# Patient Record
Sex: Female | Born: 1980 | Race: White | Hispanic: No | Marital: Married | Smoking: Never smoker
Health system: Southern US, Community
[De-identification: ages and names within clinical notes are randomized; demographics above are authoritative.]

## PROBLEM LIST (undated history)

## (undated) DIAGNOSIS — I8390 Asymptomatic varicose veins of unspecified lower extremity: Secondary | ICD-10-CM

## (undated) DIAGNOSIS — Z531 Procedure and treatment not carried out because of patient's decision for reasons of belief and group pressure: Secondary | ICD-10-CM

## (undated) DIAGNOSIS — E063 Autoimmune thyroiditis: Secondary | ICD-10-CM

## (undated) DIAGNOSIS — D509 Iron deficiency anemia, unspecified: Secondary | ICD-10-CM

## (undated) DIAGNOSIS — Z9889 Other specified postprocedural states: Secondary | ICD-10-CM

## (undated) DIAGNOSIS — Z8742 Personal history of other diseases of the female genital tract: Secondary | ICD-10-CM

## (undated) DIAGNOSIS — R112 Nausea with vomiting, unspecified: Secondary | ICD-10-CM

## (undated) DIAGNOSIS — Z973 Presence of spectacles and contact lenses: Secondary | ICD-10-CM

## (undated) DIAGNOSIS — Z789 Other specified health status: Secondary | ICD-10-CM

## (undated) HISTORY — PX: OTHER SURGICAL HISTORY: SHX169

## (undated) HISTORY — DX: Personal history of other diseases of the female genital tract: Z87.42

## (undated) HISTORY — PX: NO PAST SURGERIES: SHX2092

---

## 2002-07-03 HISTORY — PX: LUMBAR FUSION: SHX111

## 2007-07-04 HISTORY — PX: FINGER SURGERY: SHX640

## 2011-07-04 NOTE — L&D Delivery Note (Signed)
Delivery Note At 1:50 PM a viable and healthy female was delivered via Vaginal, Vacuum (Extractor) (Presentation: Left Occiput Anterior).  APGAR: 9, 9; weight 7 lb 4.2 oz (3294 g).  Kiwi vac applied at outlet due to fetal bradycardia.  Placenta status: Intact, Spontaneous.  Cord: 3 vessels.  Anesthesia: Pudendal  Episiotomy: None Lacerations: 2nd degree;Perineal Suture Repair: 3.0 chromic Est. Blood Loss (mL): 350  Mom to postpartum.  Baby to nursery-stable.  August Gosser D 01/23/2012, 2:55 PM

## 2011-08-08 LAB — OB RESULTS CONSOLE GC/CHLAMYDIA: Gonorrhea: NEGATIVE

## 2011-08-08 LAB — OB RESULTS CONSOLE HIV ANTIBODY (ROUTINE TESTING): HIV: NONREACTIVE

## 2011-08-08 LAB — OB RESULTS CONSOLE GBS: GBS: NEGATIVE

## 2012-01-23 ENCOUNTER — Encounter (HOSPITAL_COMMUNITY): Payer: Self-pay | Admitting: *Deleted

## 2012-01-23 ENCOUNTER — Inpatient Hospital Stay (HOSPITAL_COMMUNITY)
Admission: AD | Admit: 2012-01-23 | Discharge: 2012-01-25 | DRG: 775 | Disposition: A | Discharge status: Home / Self Care | Payer: Medicaid Other | Source: Ambulatory Visit | Attending: Obstetrics & Gynecology | Admitting: Obstetrics & Gynecology

## 2012-01-23 HISTORY — DX: Other specified health status: Z78.9

## 2012-01-23 LAB — CBC
HCT: 34.2 % — ABNORMAL LOW (ref 36.0–46.0)
Hemoglobin: 11.6 g/dL — ABNORMAL LOW (ref 12.0–15.0)
MCV: 91.7 fL (ref 78.0–100.0)
Platelets: 202 10*3/uL (ref 150–400)
RBC: 3.73 MIL/uL — ABNORMAL LOW (ref 3.87–5.11)
WBC: 12.1 10*3/uL — ABNORMAL HIGH (ref 4.0–10.5)

## 2012-01-23 MED ORDER — BUTORPHANOL TARTRATE 2 MG/ML IJ SOLN: 1.0000 mg | INTRAMUSCULAR | Status: DC | PRN

## 2012-01-23 MED ORDER — ERYTHROMYCIN 5 MG/GM OP OINT: TOPICAL_OINTMENT | Freq: Once | OPHTHALMIC | Status: DC

## 2012-01-23 MED ORDER — ACETAMINOPHEN 325 MG PO TABS: 650.0000 mg | ORAL_TABLET | ORAL | Status: DC | PRN

## 2012-01-23 MED ORDER — PENICILLIN G POTASSIUM 5000000 UNITS IJ SOLR: 5.0000 10*6.[IU] | Freq: Once | INTRAVENOUS | Status: DC

## 2012-01-23 MED ORDER — OXYTOCIN 40 UNITS IN LACTATED RINGERS INFUSION - SIMPLE MED
62.5000 mL/h | Freq: Once | INTRAVENOUS | Status: AC
  Administered 2012-01-23: 62.5 mL/h via INTRAVENOUS
  Filled 2012-01-23: qty 1000

## 2012-01-23 MED ORDER — PENICILLIN G POTASSIUM 5000000 UNITS IJ SOLR: 2.5000 10*6.[IU] | INTRAVENOUS | Status: DC

## 2012-01-23 MED ORDER — SIMETHICONE 80 MG PO CHEW
80.0000 mg | CHEWABLE_TABLET | ORAL | Status: DC | PRN
  Administered 2012-01-24: 80 mg via ORAL

## 2012-01-23 MED ORDER — PRENATAL MULTIVITAMIN CH
1.0000 | ORAL_TABLET | Freq: Every day | ORAL | Status: DC
  Administered 2012-01-24 – 2012-01-25 (×2): 1 via ORAL
  Filled 2012-01-23 (×2): qty 1

## 2012-01-23 MED ORDER — BENZOCAINE-MENTHOL 20-0.5 % EX AERO
1.0000 "application " | INHALATION_SPRAY | CUTANEOUS | Status: DC | PRN
  Administered 2012-01-23: 1 via TOPICAL
  Filled 2012-01-23 (×2): qty 56

## 2012-01-23 MED ORDER — LACTATED RINGERS IV SOLN
500.0000 mL | INTRAVENOUS | Status: DC | PRN
Start: 2012-01-23 — End: 2012-01-23

## 2012-01-23 MED ORDER — ONDANSETRON HCL 4 MG/2ML IJ SOLN: 4.0000 mg | INTRAMUSCULAR | Status: DC | PRN

## 2012-01-23 MED ORDER — CITRIC ACID-SODIUM CITRATE 334-500 MG/5ML PO SOLN: 30.0000 mL | ORAL | Status: DC | PRN

## 2012-01-23 MED ORDER — SENNOSIDES-DOCUSATE SODIUM 8.6-50 MG PO TABS
2.0000 | ORAL_TABLET | Freq: Every day | ORAL | Status: DC
  Administered 2012-01-23 – 2012-01-24 (×2): 2 via ORAL

## 2012-01-23 MED ORDER — OXYCODONE-ACETAMINOPHEN 5-325 MG PO TABS: 1.0000 | ORAL_TABLET | ORAL | Status: DC | PRN

## 2012-01-23 MED ORDER — IBUPROFEN 600 MG PO TABS: 600.0000 mg | ORAL_TABLET | Freq: Four times a day (QID) | ORAL | Status: DC | PRN

## 2012-01-23 MED ORDER — DIPHENHYDRAMINE HCL 25 MG PO CAPS: 25.0000 mg | ORAL_CAPSULE | Freq: Four times a day (QID) | ORAL | Status: DC | PRN

## 2012-01-23 MED ORDER — LANOLIN HYDROUS EX OINT: TOPICAL_OINTMENT | CUTANEOUS | Status: DC | PRN

## 2012-01-23 MED ORDER — ZOLPIDEM TARTRATE 5 MG PO TABS: 5.0000 mg | ORAL_TABLET | Freq: Every evening | ORAL | Status: DC | PRN

## 2012-01-23 MED ORDER — LIDOCAINE HCL (PF) 1 % IJ SOLN
30.0000 mL | INTRAMUSCULAR | Status: DC | PRN
  Filled 2012-01-23: qty 30

## 2012-01-23 MED ORDER — TETANUS-DIPHTH-ACELL PERTUSSIS 5-2.5-18.5 LF-MCG/0.5 IM SUSP: 0.5000 mL | Freq: Once | INTRAMUSCULAR | Status: DC

## 2012-01-23 MED ORDER — OXYTOCIN BOLUS FROM INFUSION
250.0000 mL | Freq: Once | INTRAVENOUS | Status: DC
  Filled 2012-01-23: qty 500

## 2012-01-23 MED ORDER — LACTATED RINGERS IV SOLN: INTRAVENOUS | Status: DC

## 2012-01-23 MED ORDER — IBUPROFEN 600 MG PO TABS
600.0000 mg | ORAL_TABLET | Freq: Four times a day (QID) | ORAL | Status: DC
  Administered 2012-01-23 – 2012-01-25 (×8): 600 mg via ORAL
  Filled 2012-01-23 (×8): qty 1

## 2012-01-23 MED ORDER — DIBUCAINE 1 % RE OINT
1.0000 "application " | TOPICAL_OINTMENT | RECTAL | Status: DC | PRN
  Administered 2012-01-23: 1 via RECTAL
  Filled 2012-01-23: qty 28

## 2012-01-23 MED ORDER — ONDANSETRON HCL 4 MG/2ML IJ SOLN: 4.0000 mg | Freq: Four times a day (QID) | INTRAMUSCULAR | Status: DC | PRN

## 2012-01-23 MED ORDER — WITCH HAZEL-GLYCERIN EX PADS
1.0000 "application " | MEDICATED_PAD | CUTANEOUS | Status: DC | PRN
  Administered 2012-01-23: 1 via TOPICAL

## 2012-01-23 MED ORDER — ONDANSETRON HCL 4 MG PO TABS: 4.0000 mg | ORAL_TABLET | ORAL | Status: DC | PRN

## 2012-01-23 MED ORDER — FLEET ENEMA 7-19 GM/118ML RE ENEM: 1.0000 | ENEMA | RECTAL | Status: DC | PRN

## 2012-01-23 NOTE — H&P (Addendum)
31 y.o. G1P0  Estimated Date of Delivery: 01/22/12 admitted at 40/[redacted] weeks gestation in active labor.  Prenatal Transfer Tool  Maternal Diabetes: No Genetic Screening: Normal Maternal Ultrasounds/Referrals: Normal Fetal Ultrasounds or other Referrals:  None Maternal Substance Abuse:  No Significant Maternal Medications:  None Significant Maternal Lab Results: Lab values include: Rh negative (recieved RhoGam prophylaxis) Other Significant Pregnancy Complications:  None  Afebrile, VSS Heart and Lungs: No active disease Abdomen: soft, gravid, EFW AGA. Cervical exam:  8/C   Impression: Active labor  Plan:  TOL

## 2012-01-23 NOTE — Progress Notes (Signed)
UR Chart review completed.  

## 2012-01-23 NOTE — MAU Note (Signed)
Patient states contractions every 2 minutes with bloody show, no leaking. Reports good fetal movement.

## 2012-01-23 NOTE — MAU Note (Signed)
Was last checked 7/17 and was 3 cm and 50 % effaced.  UC started today at 0600.  Noticed bloody show around 0730.   UC now 2-5 min apart.

## 2012-01-24 LAB — CBC
HCT: 27.5 % — ABNORMAL LOW (ref 36.0–46.0)
MCV: 92.6 fL (ref 78.0–100.0)
Platelets: 181 10*3/uL (ref 150–400)
RBC: 2.97 MIL/uL — ABNORMAL LOW (ref 3.87–5.11)
RDW: 12.9 % (ref 11.5–15.5)
WBC: 9.2 10*3/uL (ref 4.0–10.5)

## 2012-01-24 NOTE — Progress Notes (Signed)
PPD#1 Pt without c/o. VSSAF IMP/ stable Plan/ discharge in am.

## 2012-01-24 NOTE — Progress Notes (Signed)
Notified Dr. Dareen Piano of refusal of receiving Rhogam. Dr. Dareen Piano stated that pt. Should receive medication but if pt. Refuses and she knows the consequences then there is nothing else to do. Pt. Will be given refusal form to fill out.

## 2012-01-25 MED ORDER — RHO D IMMUNE GLOBULIN 1500 UNIT/2ML IJ SOLN
300.0000 ug | Freq: Once | INTRAMUSCULAR | Status: AC
  Administered 2012-01-25: 300 ug via INTRAMUSCULAR
  Filled 2012-01-25: qty 2

## 2012-01-25 NOTE — Progress Notes (Signed)
Agreed to take Rhogam injection after Dr, talked to Pt. Several times about medication.

## 2012-01-25 NOTE — Discharge Summary (Signed)
Obstetric Discharge Summary Reason for Admission: onset of labor Prenatal Procedures: none Intrapartum Procedures: vacuum Postpartum Procedures: none Complications-Operative and Postpartum: none except 2 degree epis Hemoglobin  Date Value Range Status  01/24/2012 9.4* 12.0 - 15.0 g/dL Final     DELTA CHECK NOTED     REPEATED TO VERIFY     HCT  Date Value Range Status  01/24/2012 27.5* 36.0 - 46.0 % Final    Discharge Diagnoses: Term Pregnancy-delivered  Discharge Information: Date: 01/25/2012 Activity: pelvic rest Diet: routine Medications: Ibuprofen Condition: stable Instructions: refer to practice specific booklet Discharge to: home   Newborn Data: Live born female  Birth Weight: 7 lb 4.2 oz (3294 g) APGAR: 9, 9  Home with mother.  Rebekah Atkins A 01/25/2012, 8:08 AM

## 2012-01-25 NOTE — Progress Notes (Signed)
Patient is eating, ambulating, voiding.  Pain control is good.  Filed Vitals:   01/24/12 0514 01/24/12 1432 01/24/12 2208 01/25/12 0536  BP: 100/62 117/64 91/55 133/80  Pulse: 59 74 73 137  Temp: 97.9 F (36.6 C) 98 F (36.7 C) 98.3 F (36.8 C) 98 F (36.7 C)  TempSrc: Oral Oral Oral Oral  Resp: 16 18 18 18   SpO2:        Fundus firm Perineum without swelling.  Lab Results  Component Value Date   WBC 9.2 01/24/2012   HGB 9.4* 01/24/2012   HCT 27.5* 01/24/2012   MCV 92.6 01/24/2012   PLT 181 01/24/2012    --/--/O NEG (07/24 0540)/RI  Atkins/P Post partum day 2.  Routine care.  Expect d/c today.   Pt is RH neg and baby is Rh positive. Pt is Atkins Jehovah's witness and had her Rhogam late at 33 weeks.  She was told that because she had it late she would not have to get Atkins second dose.  She does have Atkins positive antibody screen that is passive from the Rhogam but I d/w blood bank that even with Atkins positive antibody screen, it is still recommended that she get Atkins postpartum dose because there is no way to know if the Rhogam in the blood stream is enough to cover fetal blood that may have been introduced into the blood stream from the trauma of delivery.  After careful consideration, the pt agrees to receive Rhogam to prevent immunization.   Rebekah Atkins

## 2012-01-27 LAB — RH IG WORKUP (INCLUDES ABO/RH)
ABO/RH(D): O NEG
Antibody Screen: POSITIVE
DAT, IgG: NEGATIVE
Fetal Screen: NEGATIVE

## 2012-01-29 ENCOUNTER — Ambulatory Visit (HOSPITAL_COMMUNITY)
Admit: 2012-01-29 | Discharge: 2012-01-29 | Disposition: A | Discharge status: Home / Self Care | Payer: Medicaid Other | Attending: Obstetrics & Gynecology | Admitting: Obstetrics & Gynecology

## 2012-01-29 NOTE — Progress Notes (Signed)
Adult Lactation Consultation Outpatient Visit Note  Patient Name: Rebekah Atkins Date of Birth: 04/15/1981 Gestational Age at Delivery: [redacted]w[redacted]d Type of Delivery:   Breastfeeding History: Frequency of Breastfeeding: Mom reports that baby feeds every 1-2 hours and cluster feeds a few times throughout the day. Reports that she does not feel her milk has come in yet. Length of Feeding: 30-60 minutes Voids: 6 Stools: 3  Supplementing / Method: Pumping:  Type of Pump:Has own Medela PIS   Frequency:1-2 times/ day  Volume:  1 oz  Comments:    Consultation Evaluation: Baby had fed at home just prior to coming here for appointment.  Initial Feeding Assessment: Pre-feed Weight:7- 2.6 oz 3250g Post-feed Weight: 7- 2.9 oz 3258g Amount Transferred:8 cc's Comments:Baby awakened and latched well to left breast but after a few minutes of good sucking went off to sleep. Mom reports that baby stays at the breast for long periods of time but at times is mostly sleeping. Encouraged to awaken well- take all his clothes off and stimulate him to keep him awake. Encouraged to nurse on both breasts at each feeding. Has been pumping once a day and bottle fed him her EBM. Reports that breasts do not feel much fuller yet. Reports that pumped milk is creamy colored. Mom expressed breast milk here and it looks more whitish. Saw Ped on Friday. Plans to change Pediatricians but has not made another appointment for follow up.   Additional Feeding Assessment: Pre-feed Weight: Post-feed Weight: Amount Transferred: Comments:Attempted latch on right breast but baby would open but not suck.  Additional Feeding Assessment: Pre-feed Weight: Post-feed Weight: Amount Transferred: Comments:  Total Breast milk Transferred this Visit: 8 cc's Total Supplement Given:   Additional Interventions: Encouraged mom to pump 6 times/ day and feed all EBM to baby. Baby is gaining weight and voiding and stooling. Does not want to  give formula. Reviewed nutritive vs non nutritive sucking. Encouraged good nutritive sucking for 15- 20 minutes on each breast. No questions at present. To call prn   Follow-Up  Friday Aug 2 at 2;30 pm here    Pamelia Hoit 01/29/2012, 5:01 PM

## 2012-02-02 ENCOUNTER — Inpatient Hospital Stay (HOSPITAL_COMMUNITY): Admission: RE | Admit: 2012-02-02 | Payer: Medicaid Other | Source: Ambulatory Visit

## 2012-02-05 ENCOUNTER — Ambulatory Visit (HOSPITAL_COMMUNITY)
Admission: RE | Admit: 2012-02-05 | Discharge: 2012-02-05 | Disposition: A | Discharge status: Home / Self Care | Payer: Medicaid Other | Source: Ambulatory Visit | Attending: Obstetrics & Gynecology | Admitting: Obstetrics & Gynecology

## 2012-02-05 NOTE — Progress Notes (Signed)
Adult Lactation Consultation Outpatient Visit Note  Patient Name: Rebekah Atkins (MOTHER)  BABY:  [name] Date of Birth: 06-22-1981                                   DOB: [birth year] Gestational Age at Delivery: [redacted]w[redacted]d                BIRTH WEIGHT: 7-4.2 Type of Delivery:                                              WEIGHT TODAY: 7-3.8  Breastfeeding History: Frequency of Breastfeeding: EVERY 1-3 HOURS Length of Feeding: 30-90 MINUTES Voids: QS Stools: QS  Supplementing / Method:EBM/BOTTLE 3-4 OZ IN 24 HOURS PC WHEN ACTING HUNGRY AFTER CLUSTER FEEDING Pumping:  Type of Pump:LACTINA DEBP   Frequency:4 TIMES/DAY  Volume: 30 MLS   Comments:    Consultation Evaluation:Mom and 53 week old baby here for 1 week follow up for possible delayed milk supply.  Husband present at appointment and supportive.  Baby has only gained 1.8 oz/1 week.  Mom states baby nurses often and cluster feeds some.  Nipples healed and he latches easily.  Baby still mildly jaundiced to abdomen.  Observed mom latch baby onto right breast easily with good technique.  Baby has deep latch.  Baby starts out for first 5 minutes active with swallows but then requires a lot of stimulation and breast massage to nurse well.  Baby finished first breast and latched well to left breast.  Baby sleepier on the 2nd breast with more non-nutritive sucking.  Baby did transfer 70 mls total.  Mom c/o of warm, tender pink area underside and outer area of right breast.  Area is slightly firm.  Mom does report a fever of 101 2 days ago but feels area is improving.  She does have RX for antibiotics from MD and will start them if area worsens by tomorrow after using warms soaks leaning breast into bowl of water.  Denies fever or flu like symptoms today.  Encouraged mom to continue feeding on cue using good waking techniques, pump both breasts after daytime feedings and give any expressed milk to baby.  Follow up apointment in 1 week.  Initial  Feeding Assessment:RIGHT BREAST 20 MINUTES  Pre-feed ZOXWRU:0454 Post-feed UJWJXB:1478 Amount Transferred:42 MLS Comments:  Additional Feeding Assessment:LEFT BREAST 20 MINUTES Pre-feed Weight:3328 Post-feed GNFAOZ:3086 Amount Transferred:28 MLS Comments:  Additional Feeding Assessment: Pre-feed Weight: Post-feed Weight: Amount Transferred: Comments:  Total Breast milk Transferred this Visit: 70 MLS Total Supplement Given: NONE  Additional Interventions:   Follow-Up OUTPATIENT Monday 02/12/12 1:00PM      Hansel Feinstein 02/05/2012, 2:37 PM

## 2012-02-12 ENCOUNTER — Ambulatory Visit (HOSPITAL_COMMUNITY)
Admission: RE | Admit: 2012-02-12 | Discharge: 2012-02-12 | Disposition: A | Discharge status: Home / Self Care | Payer: Medicaid Other | Source: Ambulatory Visit | Attending: Obstetrics & Gynecology | Admitting: Obstetrics & Gynecology

## 2012-02-12 NOTE — Progress Notes (Signed)
Adult Lactation Consultation Outpatient Visit Note  Patient Name: Rebekah Atkins                         Baby Boy Rebekah Atkins, DOB 01/[birth year], birth weight 7 lb 4 oz., Weight at last LC OP visit on 02/05/12 was                                                                             7 lb. 3.8 oz. Date of Birth: June 22, 1981 Gestational Age at Delivery: Unknown Type of Delivery: SVB  Breastfeeding History: Frequency of Breastfeeding: 12 times in 24 hours Length of Feeding: average 30 minutes, sometimes feeds back and forth from one breast to another, cluster feeds Voids: 8/day Stools: 4-5/day  yellow  Supplementing / Method: Pumping:  Type of Pump:  Lactina from [facility]   Frequency:  3 times/day  Volume:  1 oz from 1 breast  Comments: Mom is here for feeding assessment. She developed Mastitis in the right breast and is on antibiotics (Keflex) she has 2 days left to complete prescription. Right breast has grape sized nodule at 12:00 approx 1 inch above aerola, some redness noted approx 1 inch from base of aerola outward around breast. Mom reports sharp pains with nursing on this side and did have to stop nursing for awhile and pump. Mom reports when she pumps milk from the right side it is thick. She obtains more milk from the right breast than the left with pumping. Rebekah Atkins is breast feeding/cluster feeding throughout the day. She is pumping on average 3 times in 24 hours and giving Baby Rebekah Atkins back what EBM she obtains usually 1 oz. Using bottle and slow flow nipple. Baby Rebekah Atkins has gained 6.1 oz in the past 7 days.    Consultation Evaluation: Mom latched Baby Rebekah Atkins in cross cradle on the left breast to start at this visit. He appears to have a deep latch, some swallows audible but after 17 minutes he transferred 16 ml. Some nipple compression when he delatched. While baby Rebekah Atkins was nursing on the left breast, placed a warm compress on Mom's right breast where the nodule is palpable. After nursing  on the left breast, baby Rebekah Atkins nursed on the right breast for 15 minutes, lots of swallows audible with massaging the breast and working out the nodule, baby Rebekah Atkins tranferred 20 ml.Marland Kitchen Re-latched Baby Rebekah Atkins to left breast in football hold, he nursed for 10 minutes and transferred 4 ml. The same on the right breast in foot ball hold. No compression line visible in foot ball hold. Mom reports discomfort with nursing regardless of the position on the right breast No cracking or bleeding noted. The nodule on the right breast was softer and smaller after nursing. Pain with nursing on the right breast did improve as the baby nursed but did not completely resolve.   Initial Feeding Assessment: Pre-feed Weight:   7 lb. 9.9 oz/3456gm Post-feed Weight:  7 lb. 10.5 oz/3472 gm Amount Transferred:  16 ml. Comments:  Left breast  Additional Feeding Assessment: Pre-feed Weight:  7 lb. 10.5 oz/3472 gm Post-feed Weight:  7 lb. 11.2 oz/3492 gm Amount Transferred: 20 ml Comments:  Right breast  Additional Feeding Assessment: Pre-feed Weight:       7 lb. 11.2 oz/3492 gm Post-feed Weight:      7 lb. 11.5 oz/3500 gm Amount Transferred:  4ml from left breast, 4 ml from right breast Comments:  Total Breast milk Transferred this Visit: 44 ml.  Total Supplement Given: None  Additional Interventions: Advised mom to place a warm compress before nursing or pumping the right breast and continue massage the nodule to empty this milk duct and resolve mastitis. Advised to work with foot ball hold, baby appears to obtain a deeper latch in this position. Advised mom to breastfeed every 2-3 hours or with feeding ques, keeping Baby Rebekah Atkins active at the 1st breast for 20-30 minutes then switch to 2nd breast. Post pump after each feeding during the day, pump for 15 minutes, massage for 5 minutes, then pump an additional 5 minutes to empty breast well. Give Baby Rebekah Atkins back any amount of EBM available but at least 1 oz.  Continue antibiotics for mastitis till complete. Be sure to massage with nursing to help empty breast. Monitor voids and stools. Discussed effect of mastitis on milk supply. Mom plans to re-start Fenugreek, instructions given for Lecithin to start to help with clogged ducts/mastitis. Baby Rebekah Atkins transferred less milk this visit than last week, but his voids and stools are appropriate and his weight gain is appropriate. Will re-check pre-post weight and breast status with mastitis in 2 days with followup scheduled Thursday. Baby Rebekah Atkins has breastfeed about 1 1/2  Hours before this visit. Mom is to pump when she gets home and give Baby Rebekah Atkins what breast milk she obtains to complete this feeding. Comfort gels given to use on right breast.   Follow-Up  Thursday, 02/15/12  1:00    Alfred Levins 02/12/2012, 2:47 PM

## 2012-02-15 ENCOUNTER — Ambulatory Visit (HOSPITAL_COMMUNITY)
Admission: RE | Admit: 2012-02-15 | Discharge: 2012-02-15 | Disposition: A | Discharge status: Home / Self Care | Payer: Medicaid Other | Source: Ambulatory Visit | Attending: Obstetrics & Gynecology | Admitting: Obstetrics & Gynecology

## 2012-02-15 NOTE — Progress Notes (Signed)
Adult Lactation Consultation Outpatient Visit Note  Patient Name: Rebekah Atkins Date of Birth: 1980-08-16 Gestational Age at Delivery: Unknown Type of Delivery:   Breastfeeding History: Frequency of Breastfeeding: Q 1-2 hours Length of Feeding: 1- 1/2 hours Voids: Lots  Stools: Lots Mom has had mastitis- just took last dose of antibiotic this morning. Reports that breast is feeling better but still feels lump behind nipple. Supplementing / Method: Pumping:  Type of Pump:Lactina   Frequency: 2-3x/Day  Volume:  1 oz  Comments:    Consultation Evaluation:  Initial Feeding Assessment: Pre-feed Weight: 7 13.0oz   3544g Post-feed Weight: 7-13.6  3560 Amount Transferred: 16 cc's Comments: Baby nursed on left breast for 25 minutes. Swallows noted at the beginning of the feeding but got sleepy after 15 minutes. Mom reports that baby fed 2 hours ago. Nipple did look slightly pinched. Encouraged mom to keep baby close to breast with good breast support.   Additional Feeding Assessment: Pre-feed Weight: 7- 13.9  3560g Post-feed Weight: 7- 14.9  3596g   Amount Transferred: 36 cc's.  Comments: Baby nursed on right breast (the one with mastis) Mom reports that she has been having a lot of pain with nursing on that breast and she has not latched the baby to that breast for 24 hours- has been pumping that breast and bottle feeding EBM. Assisted with deep latch and mom reports that feels much better. After nursing the lump behind the nipple was almost gone.  Additional Feeding Assessment: Pre-feed Weight: 7-14.9  3596g Post-feed Weight: 7- 15.3  3608g Amount Transferred: 12cc's Comments: Baby still acted hungry and nursed on the right breast again for 10 minutes, then off to sleep.  Total Breast milk Transferred this Visit: 68cc's Total Supplement Given: 0  Additional Interventions: Encouraged to continue what she has been doing- massage, warm compress on right breast before nursing on  that breast Feels like this feeding went well. To call with questions/ concerns.  Follow-Up  Will call and make OP appointment if needed.    Pamelia Hoit 02/15/2012, 3:35 PM

## 2012-02-26 ENCOUNTER — Ambulatory Visit (HOSPITAL_COMMUNITY)
Admission: RE | Admit: 2012-02-26 | Discharge: 2012-02-26 | Disposition: A | Discharge status: Home / Self Care | Payer: Medicaid Other | Source: Ambulatory Visit | Attending: Obstetrics and Gynecology | Admitting: Obstetrics and Gynecology

## 2012-02-26 NOTE — Progress Notes (Addendum)
Adult Lactation Consultation Outpatient Visit Note  Patient Name: Rebekah Atkins                                  Phineas Stene Date of Birth: December 03, 1980                                               DOB: [birth year] Gestational Age at Delivery: Unknown                       Birth weight: 7-4.2 Type of Delivery:   Breastfeeding History: Frequency of Breastfeeding: every 1-2 hours Length of Feeding:30-60 mins  Voids: 7-8 Stools: 1 brownish yellow since Friday  Supplementing / Method: Pumping:  Type of Pump:Lactina   Frequency:3 times day before yesterday and none yesterday  Volume:  1-3 ounces  Comments: Mother states main concern today is her (R) nipple. nipple is cracked and extremely painful. She complaints of painful area on outer side of nipple. (appears to be a swollen montgomery gland or stray milk pour ). Area is very tender to touch, but no redness observed.  Mother states she was treated for Mastitis for 5 days (Keflex), several weeks ago. She began another round of antibiotics yesterday (Doxycycline),  for 10 more days. She states she did have fever and painful right breast.  Mother is also concerned that infant is feeding every 1-2 hours and still seems hungry . She is concerned about her milk supply. She has been taking Fenugreek 3 caps 3 times daily . She states she was pumping regularly but infant was requiring so much of her time feeding him. She has stopped pumping and is not just breastfeeding.   Consultation Evaluation: Observed that infant still has some jaundice on neck, face and eyes. Mother states she has switched from [facility] to [facility] and she has appt on Wednesday which was first available.  Mothers breast are filling but not firm. She can express milk easily. Infant latched well for 22 mins. On (R) breast and transferred 24 ml. Lots of breast compression was used to stimulate suckling. Observed lots of non-nutritive suckling on (L) breast for 10 mins and  transferred 4 ml and another 6 ml after 10 more mins,. Total feeding was 34ml   Recommend that we try SNS with formula to observe infants suckling . Infant transferred entire 30 ml with another 6 ml from mother. Infant continued to be hungry and was offered another 30ml with SNS. Infant took 26ml of formula and 4ml from mother. Total feeding was 56 ml from SNS.  Initial Feeding Assessment: Pre-feed ZOXWRU:0454 Post-feed UJWJXB:1478 Amount Transferred:41ml Comments:  Additional Feeding Assessment: Pre-feed GNFAOZ:3086 Post-feed VHQION:6295 Amount Transferred:35ml Comments:  Additional Feeding Assessment: Pre-feed MWUXLK:4401 Post-feed UUVOZD:6644 Amount Transferred; 36ml: Comments:  Additional feeding assessment: Pre-feeding weight:3904 Post-feeding weight:3934 Amount transferred:74ml   Total Breast milk Transferred this Visit: 44ml  Total Supplement Given: 56ml formula Total feeding : Additional Interventions:  Mother given lactation plan to follow. 1). Supplement infant with formula or expressed milk after each feeding using bottle or SNS giving 45-60 ml or until infant is satisfied. 2) mother to pump at least 6 -8 times daily after each feeding. 3) continue to take fenugreek and antibiotic , nap freq and drink well.  4) mother to call MD and request all purpose nipple cream and use as directed.   Mother has follow up appt on Wednesday with Dr Tylene Fantasia at [facility].  Follow-Up  August 30 at 1:00     Stevan Born [facility] 02/26/2012, 4:08 PM

## 2012-03-01 ENCOUNTER — Ambulatory Visit (HOSPITAL_COMMUNITY)
Admission: RE | Admit: 2012-03-01 | Discharge: 2012-03-01 | Disposition: A | Discharge status: Home / Self Care | Payer: Medicaid Other | Source: Ambulatory Visit | Attending: Obstetrics & Gynecology | Admitting: Obstetrics & Gynecology

## 2012-03-01 NOTE — Progress Notes (Signed)
Adult Lactation Consultation Outpatient Visit Note  Patient Name: Rebekah Atkins(mother)    BABY: Rebekah Atkins Date of Birth: 09-01-1980                                DOB: [birth year] Gestational Age at Delivery: term                BIRTH WEIGHT: 7-4.2 Type of Delivery: NVD                                  WEIGHT TODAY: 8-12.1                                                                       WEIGHT GAIN 4.5 OZ/4 DAYS Breastfeeding History: Frequency of Breastfeeding: EVERY 1-2 HOURS Length of Feeding: 30-60 MINUTES TOTAL.  NURSES ON BOTH BREASTS Voids: 8 Stools: 1/DAY  Supplementing / Method:BOTTLE 60 MLS FORMULA TWICE PER DAY AND ANY EBM EXPRESSED 1-3 OZ Pumping:  Type of Pump:DEBP   Frequency:3-4 TIMES/24HOURS  Volume:  30 MLS EACH BREAST  Comments:    Consultation Evaluation:Mom and 10 week old baby here for follow up from 02/26/12.  Mom's chief complaint is ongoing nipple soreness/painful feedings which is very stressful due to baby needing to eat almost every hour.  Baby's weight gain has been adequate because mom has been so willing to allow her baby to nurse so often.  Mom describes baby as actively feeding for first 5 minutes or so and then becoming very sleepy for remainder of feeding.  Mom states her nipple is always pinched and sometimes looks like a lipstick shape.  Patient is still taking her antibiotic for mastitis in right breast and breast tissue appears normal with no signs of infection noted.  Mom denies nipple burning or itching or shooting pains in breasts.  Both nipples look pink to light red and raw and abraded.  Observed mom latch baby onto left breast using cross cradle hold.  Mom has good technique and baby latched easily and deeply.  Baby stayed active for about 5 minutes then very non-nutritive.  Mom then latched baby to right breast using football hold and again baby latched deeply but started non nutritive sucking early into feeding.  Both nipples did look pinched  after feeding.  Mom is very motivated to breastfeed her baby but is very discouraged due to pain throughout every feeding.  She did try the nipple shield at home again a few times with no improvement. Oral examination of baby's mouth shows normal palate and what appears to be a very short posterior frenulum behind the mucous membrane of the floor of the mouth.  Baby can extend tongue but cannot elevate tongue much.  Discussed with parent's that they may want to have an MD assess this.  Discussed options for plan of care with parent's and they are agreeable to 1) pump and bottlefeed for 3-4 days to allow for some nipple healing and assess milk supply 2) use all purpose nipple ointment 6 times/day 3) bottlefeed 3-4 oz of formula/expressed milk per bottle when baby is hungry.  F/U  appointment next week.  Initial Feeding Assessment:LEFT BREAST 15 MINUTES Pre-feed Weight:3972 Post-feed ZOXWRU:0454 Amount Transferred:4 MLS Comments:  Additional Feeding Assessment:RIGHT BREAST 30 MINUTES Pre-feed [id number]:1478 Post-feed Weight:4002 Amount Transferred:26 MLS Comments:  Additional Feeding Assessment: Pre-feed Weight: Post-feed Weight: Amount Transferred: Comments:  Total Breast milk Transferred this Visit: 30 MLS Total Supplement Given: 60 MLS FORMULA  Additional Interventions:   Follow-Up  03/07/12  LACTATION APPOINTMENT      Hansel Feinstein 03/01/2012, 4:11 PM

## 2012-03-07 ENCOUNTER — Ambulatory Visit (HOSPITAL_COMMUNITY)
Admission: RE | Admit: 2012-03-07 | Discharge: 2012-03-07 | Disposition: A | Discharge status: Home / Self Care | Payer: Medicaid Other | Source: Ambulatory Visit | Attending: Obstetrics & Gynecology | Admitting: Obstetrics & Gynecology

## 2012-03-07 DIAGNOSIS — O9212 Cracked nipple associated with the puerperium: Secondary | ICD-10-CM | POA: Insufficient documentation

## 2012-03-07 NOTE — Progress Notes (Signed)
Adult Lactation Consultation Outpatient Visit Note  Patient Name: Rebekah Atkins Rebekah Atkins Date of Birth: [birth year]  DOB: [birth year] Gestational Age at Delivery:term BW: 7# 4.2 oz Type of Delivery: NVD   Today's weight: 9#12.6oz  Breastfeeding History: Frequency of Breastfeeding: 1-3 times/day Length of Feeding: varies Voids: abundant Stools: yellow (qd or qod,    large)  Supplementing / Method: Pumping:  Type of Pump: Lactina   Frequency:  8/day  Volume:  1.5-4 oz  Note: Mom is feeding [employer] formula when supplementing.  Consultation Evaluation:  Initial Feeding Assessment: Pre-feed Weight: 4438g Post-feed Weight:  4464g Amount Transferred: 26mL Comments:43min feeding (L side)  Additional Feeding Assessment: Pre-feed Weight: 4464g Post-feed Weight: 4482g Amount Transferred: 18mL Comments: 10-12 min feeding (R side)  Additional Feeding Assessment: Pre-feed Weight: Post-feed Weight: Amount Transferred: Comments:  Total Breast milk Transferred this Visit: 44mL Total Supplement Given: 0  Additional Interventions: None   Follow-Up Baby has gained a pound over the last week.  Mom has been primarily bottle-feeding w/EBM & formula (baby's intake over the last couple of days ranged from 31- 36oz). Mom's nipples have improved since last visit, as baby has been rarely feeding at breast. She has been using APNO after almost every pumping.  When baby latched to L side (26 mL transferred), nipple was noted to be pinched when baby released latch.  When baby released R nipple (18mL), the nipple shape was only slightly pinched, but more pink.  Mom has considered just pumping and bottle-feeding (b/c of the increased comfort), but Mom also doesn't want to miss out on the bonding experience of breastfeeding.  Her overall goal is to increase her milk supply and decrease the amount of formula given.  Mom has begun taking [place].  Mom was encouraged to put the baby to  the breast as she desires.    When baby cries, the following is noted about the tongue: it is unable to elevate & it forms a "U"-shape in the bottom of the mouth. When not crying, some limited lateralization was observed, as well as a "divet" in the tongue. Mom's hx of nipple soreness, baby's efficacy at the breast (which decreases over the course of a feeding) and possible lack of  tongue mobility is consistent with undiagnosed ankyloglossia.  Mom has information about Dr. Dutch Quint from a previous visit.  Mom was also given contact information for Mauricia Area, speech language therapist.   Mom was encouraged to f/u w/Lactation as she needs.     Rebekah Atkins [facility] 03/07/2012, 2:57 PM

## 2014-05-04 ENCOUNTER — Encounter (HOSPITAL_COMMUNITY): Payer: Self-pay | Admitting: *Deleted

## 2016-05-16 ENCOUNTER — Other Ambulatory Visit: Payer: Self-pay | Admitting: Family Medicine

## 2016-05-16 DIAGNOSIS — N6489 Other specified disorders of breast: Secondary | ICD-10-CM

## 2016-05-24 ENCOUNTER — Other Ambulatory Visit: Payer: Medicaid Other

## 2017-06-21 ENCOUNTER — Telehealth: Payer: Self-pay | Admitting: Genetic Counselor

## 2017-06-21 NOTE — Telephone Encounter (Signed)
06/21/17 called and left message with patient informing them of their appointment with Maylon CosKaren Powell on 08/06/16 @ 1:00 pm.  Patient is aware to arrive 30 minutes prior to appt. to register.

## 2017-08-06 ENCOUNTER — Encounter: Payer: Self-pay | Admitting: Genetic Counselor

## 2017-08-06 ENCOUNTER — Other Ambulatory Visit: Payer: Self-pay

## 2018-02-21 ENCOUNTER — Other Ambulatory Visit: Payer: Self-pay | Admitting: Family Medicine

## 2018-02-21 DIAGNOSIS — R102 Pelvic and perineal pain: Secondary | ICD-10-CM

## 2018-02-26 ENCOUNTER — Other Ambulatory Visit: Payer: Self-pay | Admitting: Family Medicine

## 2018-02-26 ENCOUNTER — Ambulatory Visit
Admission: RE | Admit: 2018-02-26 | Discharge: 2018-02-26 | Disposition: A | Discharge status: Home / Self Care | Payer: BLUE CROSS/BLUE SHIELD | Source: Ambulatory Visit | Attending: Family Medicine | Admitting: Family Medicine

## 2018-02-26 DIAGNOSIS — G8929 Other chronic pain: Secondary | ICD-10-CM

## 2018-02-26 DIAGNOSIS — M549 Dorsalgia, unspecified: Principal | ICD-10-CM

## 2018-02-26 DIAGNOSIS — R102 Pelvic and perineal pain: Secondary | ICD-10-CM

## 2018-05-22 ENCOUNTER — Other Ambulatory Visit: Payer: Self-pay | Admitting: Gastroenterology

## 2018-05-22 DIAGNOSIS — R1032 Left lower quadrant pain: Secondary | ICD-10-CM

## 2018-05-29 ENCOUNTER — Ambulatory Visit
Admission: RE | Admit: 2018-05-29 | Discharge: 2018-05-29 | Disposition: A | Discharge status: Home / Self Care | Payer: BLUE CROSS/BLUE SHIELD | Source: Ambulatory Visit | Attending: Gastroenterology | Admitting: Gastroenterology

## 2018-05-29 ENCOUNTER — Other Ambulatory Visit: Payer: BLUE CROSS/BLUE SHIELD

## 2018-05-29 DIAGNOSIS — R1032 Left lower quadrant pain: Secondary | ICD-10-CM

## 2018-05-29 MED ORDER — IOPAMIDOL (ISOVUE-300) INJECTION 61%
100.0000 mL | Freq: Once | INTRAVENOUS | Status: AC | PRN
  Administered 2018-05-29: 100 mL via INTRAVENOUS

## 2018-06-05 ENCOUNTER — Other Ambulatory Visit: Payer: Self-pay | Admitting: Family Medicine

## 2018-06-05 DIAGNOSIS — M5430 Sciatica, unspecified side: Secondary | ICD-10-CM

## 2018-06-19 ENCOUNTER — Encounter (INDEPENDENT_AMBULATORY_CARE_PROVIDER_SITE_OTHER): Payer: Self-pay | Admitting: Ophthalmology

## 2018-06-19 ENCOUNTER — Ambulatory Visit
Admission: RE | Admit: 2018-06-19 | Discharge: 2018-06-19 | Disposition: A | Discharge status: Home / Self Care | Payer: BLUE CROSS/BLUE SHIELD | Source: Ambulatory Visit | Attending: Family Medicine | Admitting: Family Medicine

## 2018-06-19 DIAGNOSIS — M5430 Sciatica, unspecified side: Secondary | ICD-10-CM

## 2018-06-19 MED ORDER — GADOBENATE DIMEGLUMINE 529 MG/ML IV SOLN
14.0000 mL | Freq: Once | INTRAVENOUS | Status: AC | PRN
  Administered 2018-06-19: 14 mL via INTRAVENOUS

## 2018-07-17 ENCOUNTER — Encounter (INDEPENDENT_AMBULATORY_CARE_PROVIDER_SITE_OTHER): Payer: Self-pay | Admitting: Ophthalmology

## 2018-08-07 ENCOUNTER — Encounter (INDEPENDENT_AMBULATORY_CARE_PROVIDER_SITE_OTHER): Payer: Medicaid Other | Admitting: Ophthalmology

## 2018-11-12 DIAGNOSIS — Z531 Procedure and treatment not carried out because of patient's decision for reasons of belief and group pressure: Secondary | ICD-10-CM | POA: Insufficient documentation

## 2018-11-15 DIAGNOSIS — Z9889 Other specified postprocedural states: Secondary | ICD-10-CM | POA: Insufficient documentation

## 2018-11-15 HISTORY — PX: LUMBAR DISC SURGERY: SHX700

## 2019-04-03 HISTORY — PX: VARICOSE VEIN SURGERY: SHX832

## 2020-03-13 IMAGING — CT CT ABD-PELV W/ CM
2 of 4 series · 12 of 46 positions shown, 14 images · IV contrast (iopamidol)
Comparison: None.

CLINICAL DATA: Left lower quadrant pain for 9 months. Change in
bowel habits.

EXAM:
CT ABDOMEN AND PELVIS WITH CONTRAST
TECHNIQUE: Multidetector CT imaging of the abdomen and pelvis was performed
using the standard protocol following bolus administration of
intravenous contrast.
CONTRAST:  100mL [id number] IOPAMIDOL ([id number]) INJECTION 61%

[Series 2: abd pelvis 5.00 br40 s3 ax · axial · 0.51mm/px · z∈[+1076,+1470]mm · 9 of 95 slices shown, 11 images]
[im 8/95  soft-tissue]
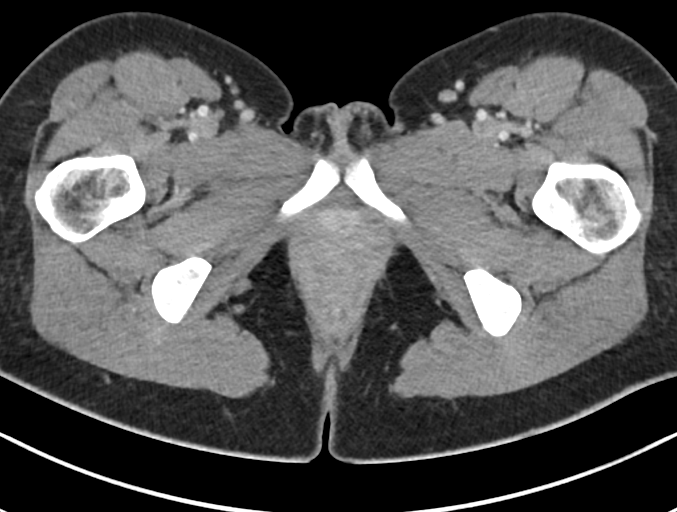
[im 8/95  bone]
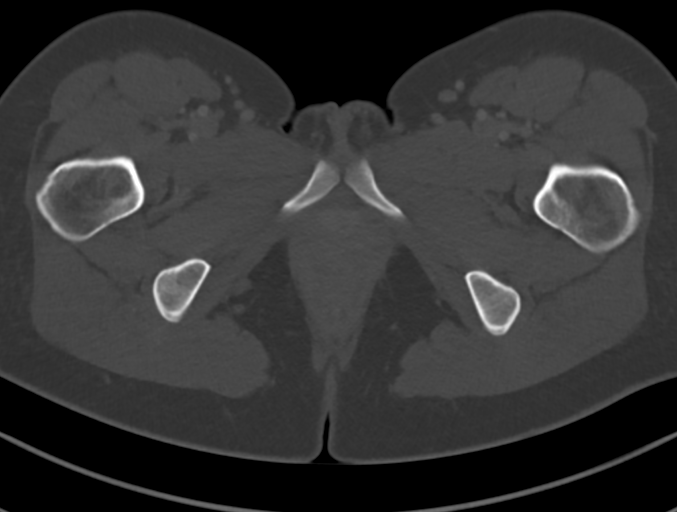
[im 16/95  soft-tissue]
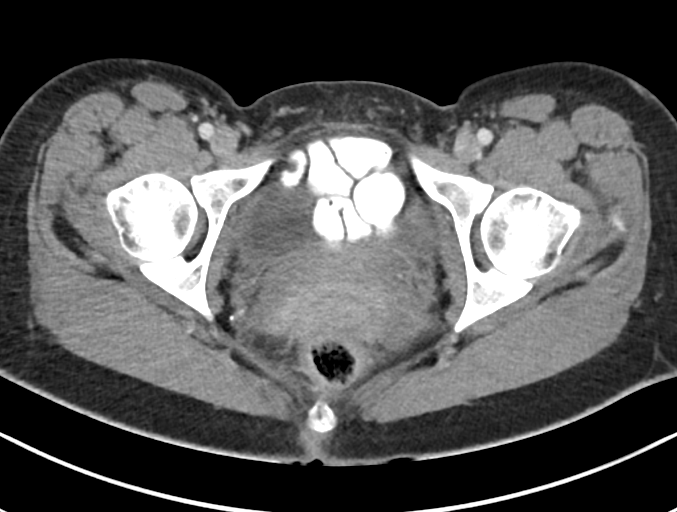
[im 28/95  soft-tissue]
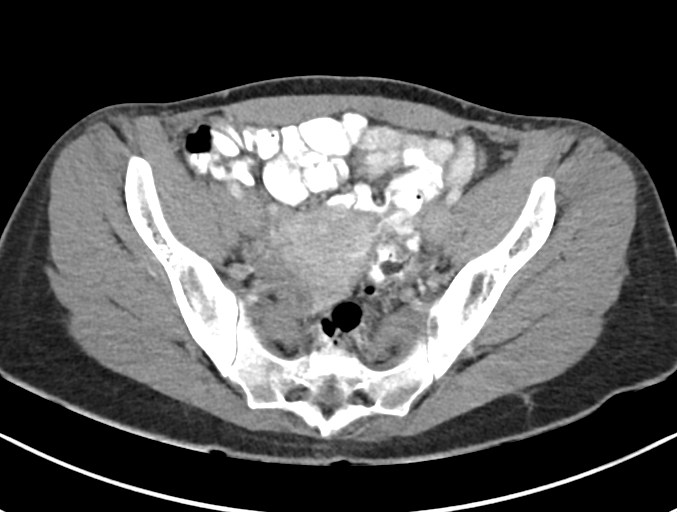
[im 36/95  soft-tissue]
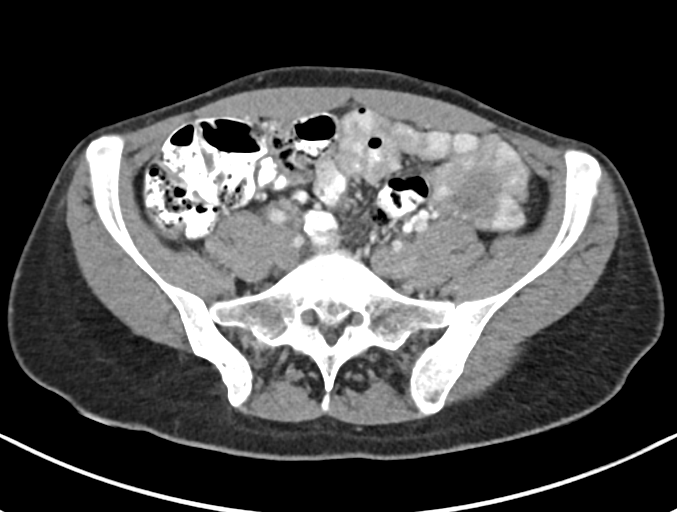
[im 48/95  soft-tissue]
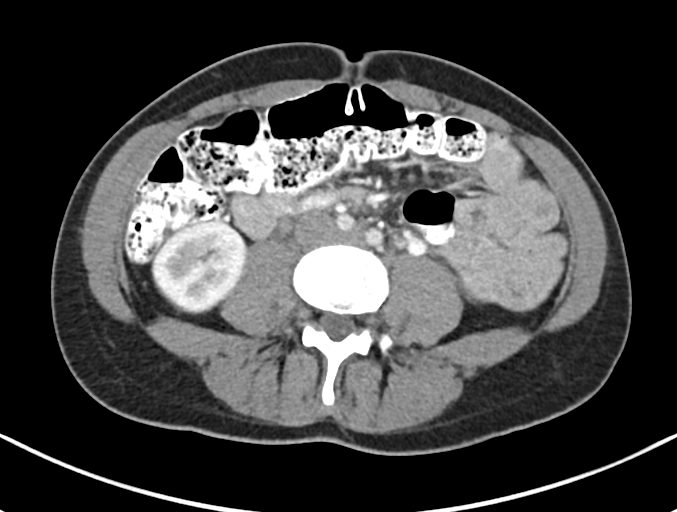
[im 59/95  soft-tissue]
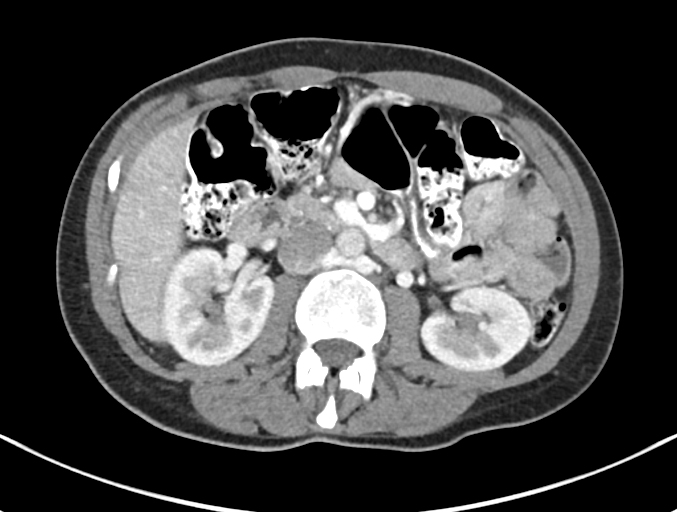
[im 67/95  soft-tissue]
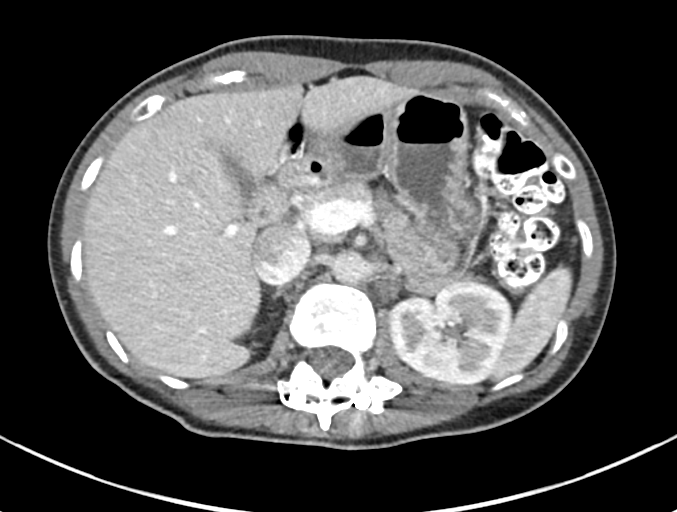
[im 79/95  soft-tissue]
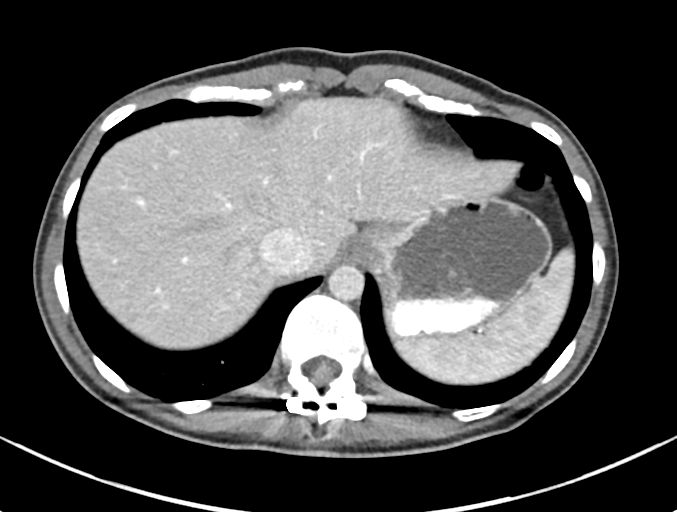
[im 87/95  soft-tissue]
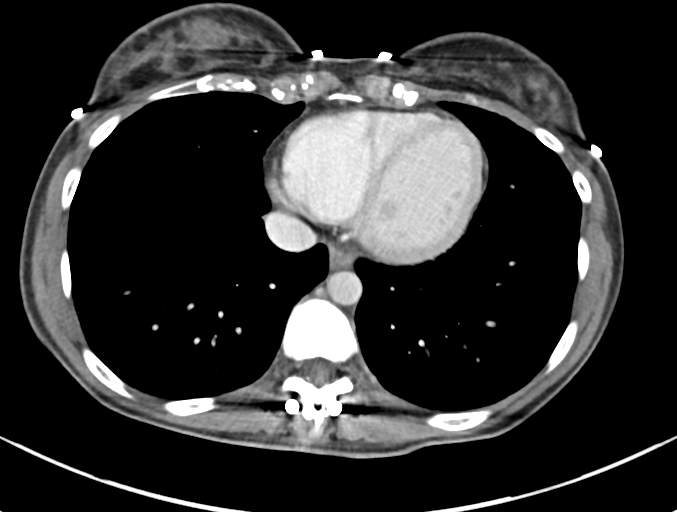
[im 87/95  bone]
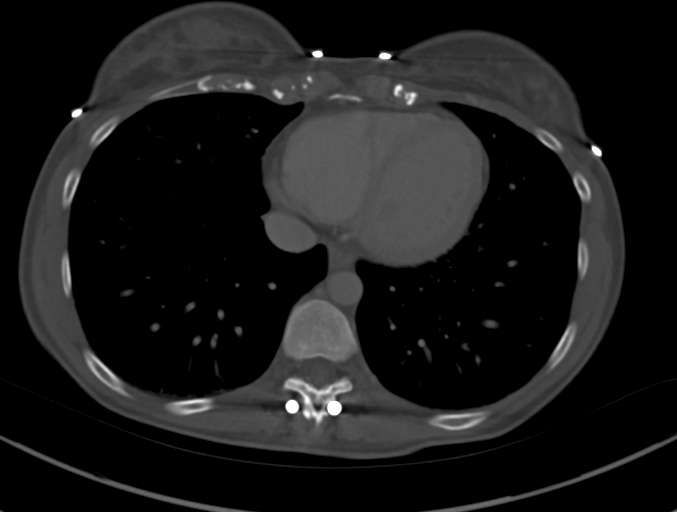

[Series 6: abd pelvis 2.00 br40 s3 cor · coronal · 0.67mm/px · 3 of 125 slices shown]
[im 42/125  soft-tissue]
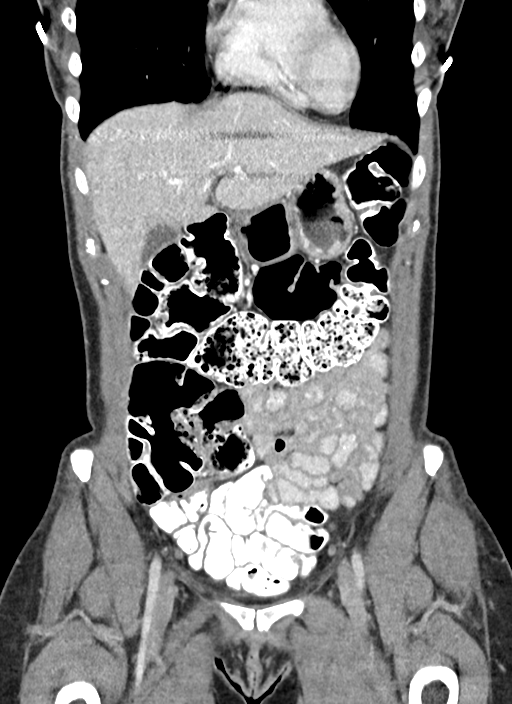
[im 56/125  soft-tissue]
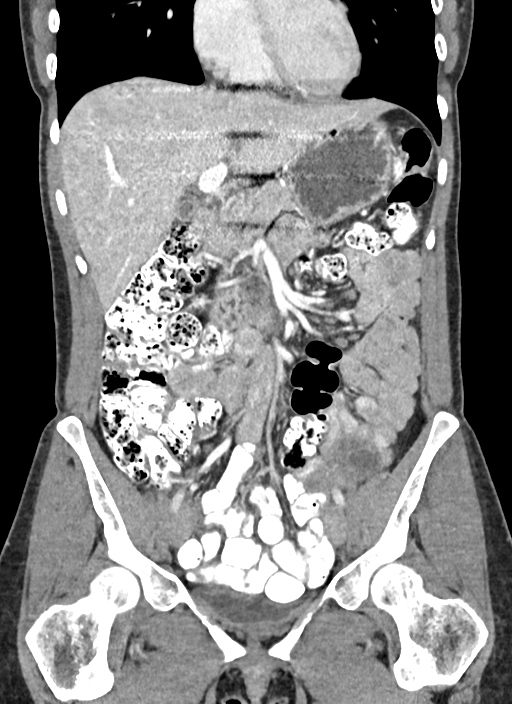
[im 69/125  soft-tissue]
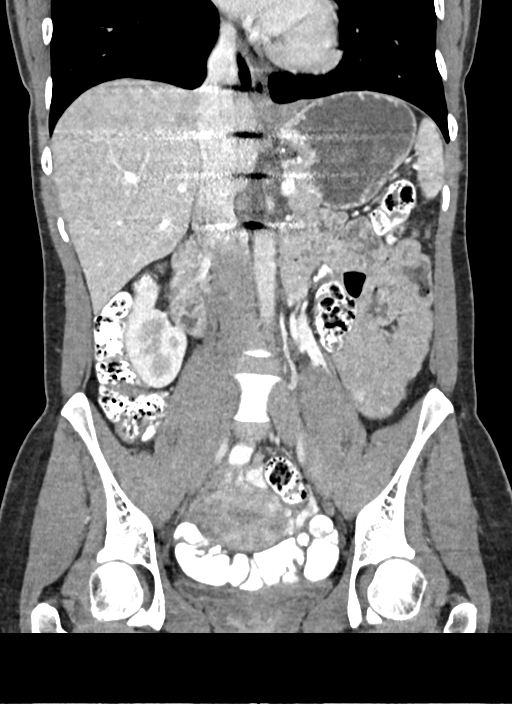

[12 of 46 positions shown; findings below may reference images not displayed]

FINDINGS: Lower Chest: No acute findings.

Hepatobiliary: No hepatic masses identified. Gallbladder is
unremarkable.

Pancreas:  No mass or inflammatory changes.

Spleen: Within normal limits in size and appearance.

Adrenals/Urinary Tract: No masses identified. No evidence of
hydronephrosis.

Stomach/Bowel: No evidence of obstruction, inflammatory process or
abnormal fluid collections. Large colonic stool burden noted.

Vascular/Lymphatic: No pathologically enlarged lymph nodes. No
abdominal aortic aneurysm.

Reproductive: 2.5 cm simple left ovarian follicular cyst. No mass or
other significant abnormality.

Other:  None.

Musculoskeletal: No suspicious bone lesions identified. Posterior
spinal fusion hardware seen in the lower thoracic and upper lumbar
spine.
IMPRESSION: No acute findings.

Large stool burden noted; recommend clinical correlation for
possible constipation.

## 2020-03-20 ENCOUNTER — Other Ambulatory Visit: Payer: BLUE CROSS/BLUE SHIELD

## 2020-03-27 ENCOUNTER — Other Ambulatory Visit: Payer: BLUE CROSS/BLUE SHIELD

## 2020-04-03 IMAGING — MR MR LUMBAR SPINE WO/W CM
8 series · 48 of 48 positions shown · IV contrast (multihance)
Comparison: None.

CLINICAL DATA: Back and LEFT leg pain. Previous instrumented fusion
thoracolumbar region.

EXAM:
MRI LUMBAR SPINE WITHOUT AND WITH CONTRAST
TECHNIQUE: Multiplanar and multiecho pulse sequences of the lumbar spine were
obtained without and with intravenous contrast.
CONTRAST:  14mL MULTIHANCE GADOBENATE DIMEGLUMINE 529 MG/ML IV SOLN

[Series 3: tirm sag · sagittal · 4.0mm · 0.55mm/px · 3 of 16 slices shown]
[im 1/16]
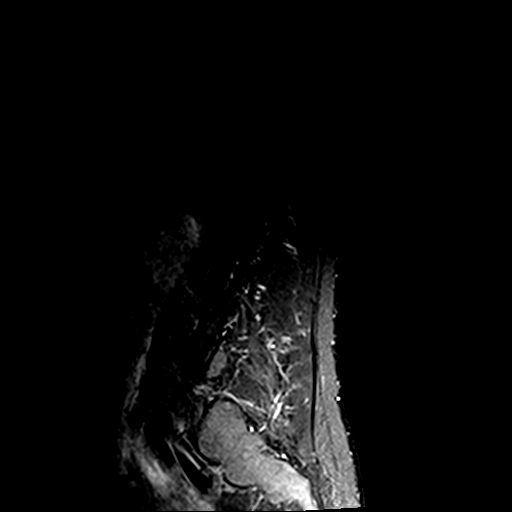
[im 8/16]
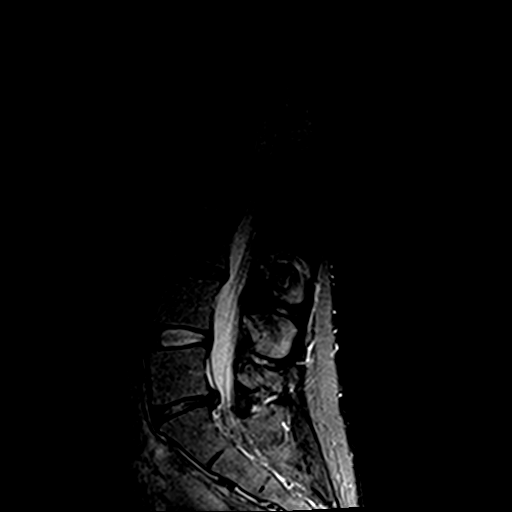
[im 16/16]
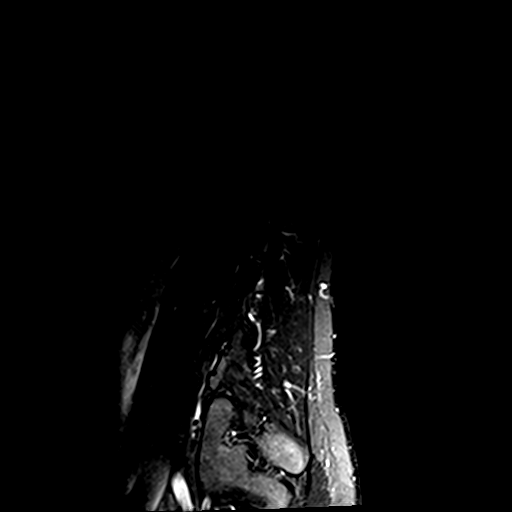

[Series 4: T1 · sagittal · 4.0mm · 0.88mm/px · 3 of 16 slices shown (1 of 3)]
[im 1/16]
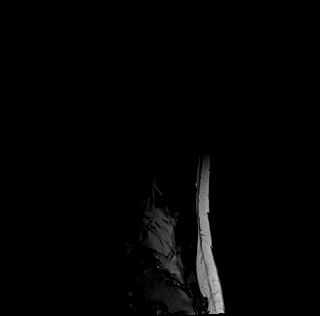
[im 8/16]
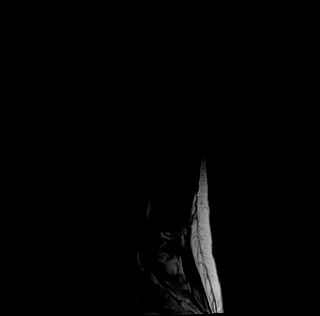
[im 16/16]
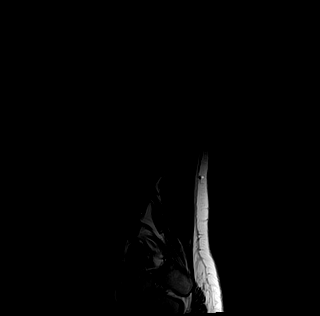

[Series 5: T1 · axial · 4.0mm · 0.70mm/px · z∈[-74,+174]mm · 11 of 52 slices shown (2 of 3)]
[im 1/52]
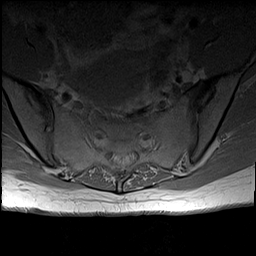
[im 6/52]
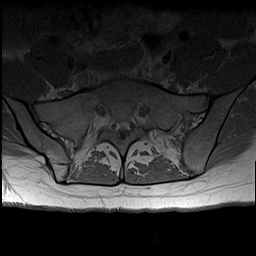
[im 11/52]
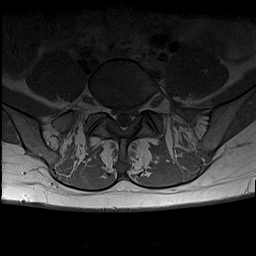
[im 16/52]
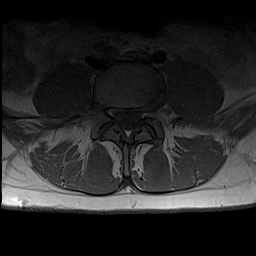
[im 21/52]
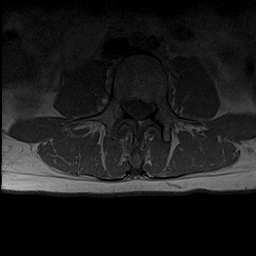
[im 26/52]
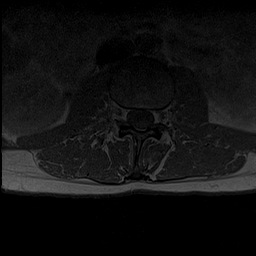
[im 31/52]
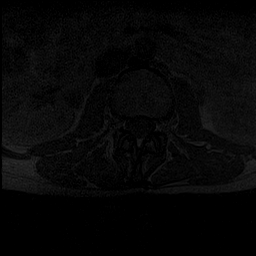
[im 36/52]
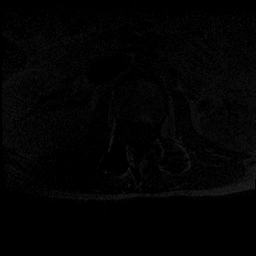
[im 41/52]
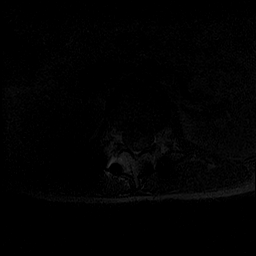
[im 46/52]
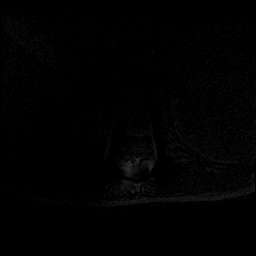
[im 52/52]
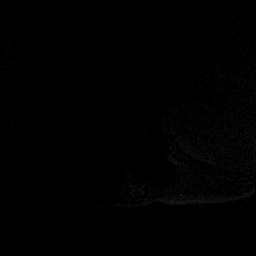

[Series 6: T2 · axial · 4.0mm · 0.70mm/px · z∈[-74,+174]mm · 11 of 52 slices shown (1 of 2)]
[im 1/52]
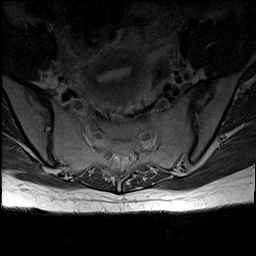
[im 6/52]
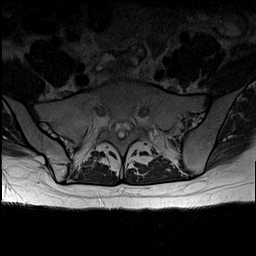
[im 11/52]
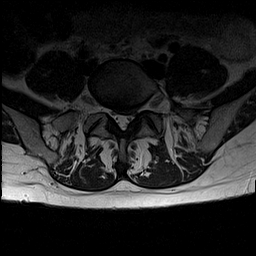
[im 16/52]
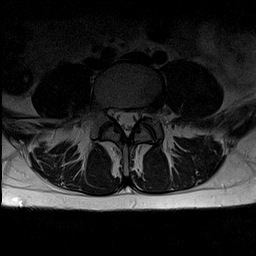
[im 21/52]
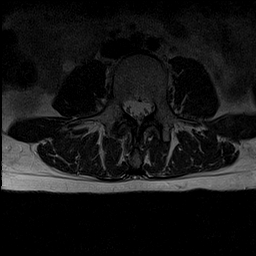
[im 26/52]
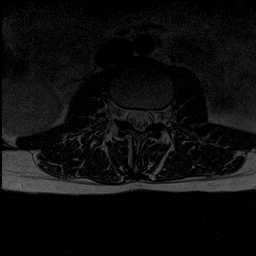
[im 31/52]
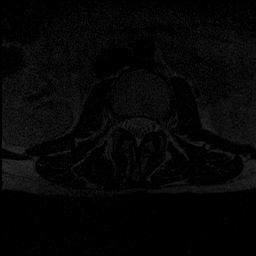
[im 36/52]
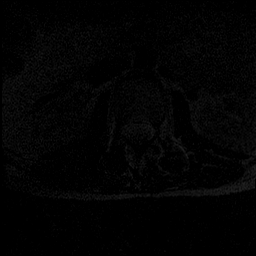
[im 41/52]
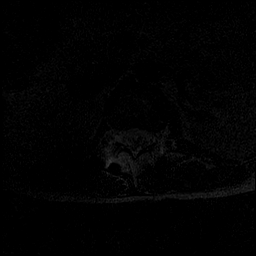
[im 46/52]
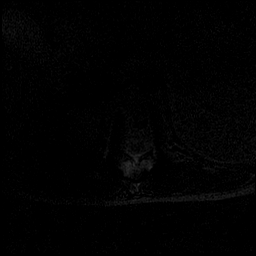
[im 52/52]
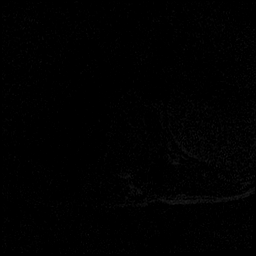

[Series 7: T2 · sagittal · 4.0mm · 0.88mm/px · 3 of 16 slices shown (2 of 2)]
[im 1/16]
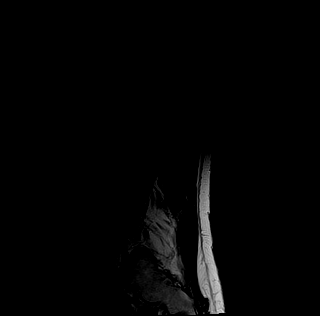
[im 8/16]
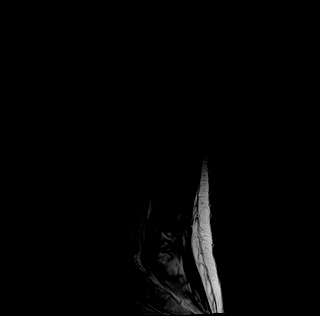
[im 16/16]
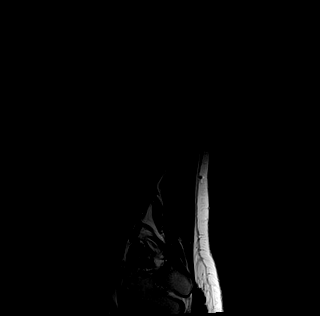

[Series 8: T1 fat-sat · sagittal · 4.0mm · 0.88mm/px · 3 of 16 slices shown]
[im 1/16]
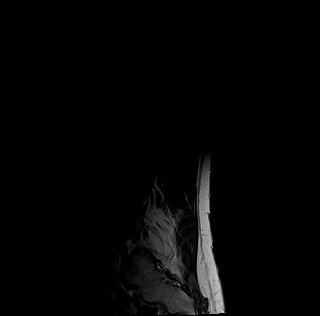
[im 8/16]
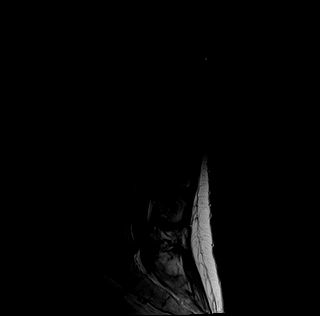
[im 16/16]
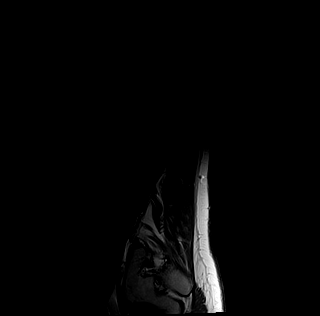

[Series 9: T1 fat-sat post-contrast · sagittal · 4.0mm · 0.88mm/px · 3 of 16 slices shown]
[im 1/16]
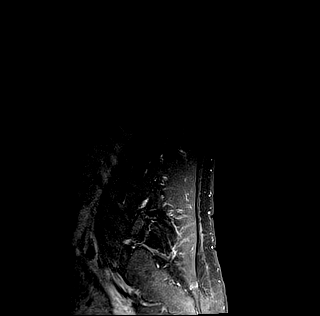
[im 8/16]
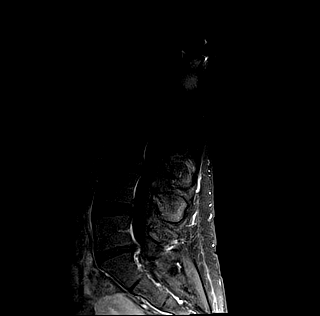
[im 16/16]
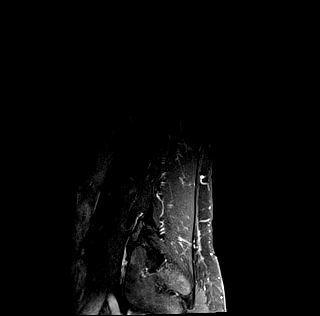

[Series 10: T1 · axial · 4.0mm · 0.70mm/px · z∈[-74,+174]mm · 11 of 52 slices shown (3 of 3)]
[im 1/52]
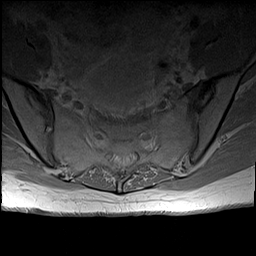
[im 6/52]
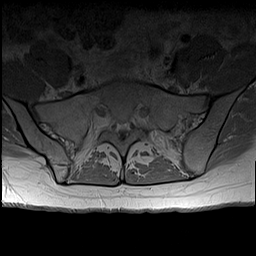
[im 11/52]
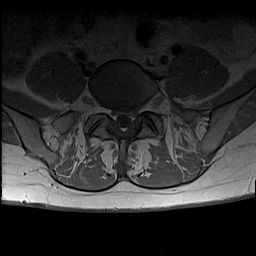
[im 16/52]
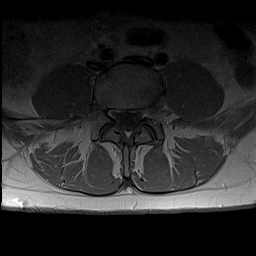
[im 21/52]
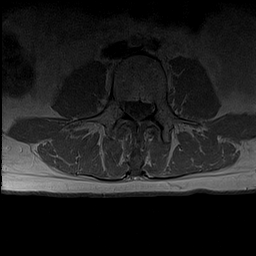
[im 26/52]
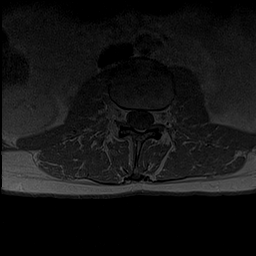
[im 31/52]
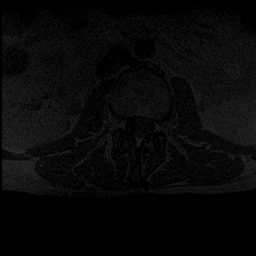
[im 36/52]
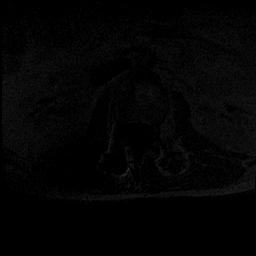
[im 41/52]
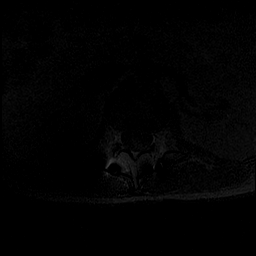
[im 46/52]
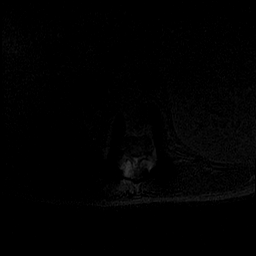
[im 52/52]
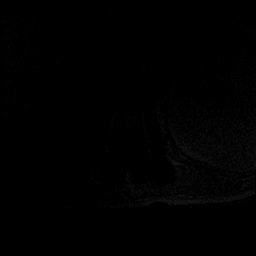

[48 of 48 positions shown; findings below may reference images not displayed]

FINDINGS: Segmentation:  Standard.

Alignment:  Anatomic

Vertebrae: No worrisome osseous lesion. Previous instrumented
thoracolumbar fusion from lower thoracic region to include L1 and
L2. Pedicle screws with rods are seen and appear to be in grossly
satisfactory position.

Conus medullaris and cauda equina: Conus extends to the L2 level.
Conus and cauda equina appear normal.

Paraspinal and other soft tissues: Unremarkable.

Disc levels:

L1-L2:  Suboptimal visualization.  No impingement.

L2-L3:  Suboptimal visualization.  No impingement.

L3-L4: Disc space narrowing. Mild facet arthropathy. There is a
shallow central protrusion, extending to the LEFT greater than RIGHT
neural foramina. Small annular rents on the RIGHT and LEFT are
suspected. No compressive subarticular zone or foraminal zone
narrowing.

L4-L5: Good disc height and hydration. Annular bulge. Facet
arthropathy. No impingement.

L5-S1: Disc space narrowing and desiccation. Central and leftward
extrusion. LEFT S1 nerve root impingement. Peridiscal enhancement is
observed at L5-S1.

No visible epidural fibrosis elsewhere.
IMPRESSION: The dominant LEFT-sided abnormality is at L5-S1 where a central and
leftward extrusion is present. LEFT S1 neural impingement is noted.
Minor lumbar spondylosis elsewhere.

Incompletely visualized posterior thoracolumbar instrumentation. No
visible lower thoracic or upper lumbar stenosis or cord compression.

## 2022-01-02 ENCOUNTER — Other Ambulatory Visit: Payer: Self-pay | Admitting: Family Medicine

## 2022-01-02 DIAGNOSIS — N6320 Unspecified lump in the left breast, unspecified quadrant: Secondary | ICD-10-CM

## 2022-01-09 ENCOUNTER — Ambulatory Visit
Admission: RE | Admit: 2022-01-09 | Discharge: 2022-01-09 | Disposition: A | Discharge status: Home / Self Care | Payer: Medicaid Other | Source: Ambulatory Visit | Attending: Family Medicine | Admitting: Family Medicine

## 2022-01-09 ENCOUNTER — Other Ambulatory Visit: Payer: Self-pay | Admitting: Family Medicine

## 2022-01-09 ENCOUNTER — Ambulatory Visit
Admission: RE | Admit: 2022-01-09 | Discharge: 2022-01-09 | Disposition: A | Discharge status: Home / Self Care | Payer: Commercial Managed Care - HMO | Source: Ambulatory Visit | Attending: Family Medicine | Admitting: Family Medicine

## 2022-01-09 DIAGNOSIS — N6320 Unspecified lump in the left breast, unspecified quadrant: Secondary | ICD-10-CM

## 2022-02-21 ENCOUNTER — Ambulatory Visit: Payer: Commercial Managed Care - HMO | Admitting: Podiatrist

## 2022-02-21 ENCOUNTER — Encounter: Payer: Self-pay | Admitting: Podiatrist

## 2022-02-21 ENCOUNTER — Ambulatory Visit: Payer: Commercial Managed Care - HMO

## 2022-02-21 DIAGNOSIS — M21611 Bunion of right foot: Secondary | ICD-10-CM

## 2022-02-21 DIAGNOSIS — M21612 Bunion of left foot: Secondary | ICD-10-CM

## 2022-02-21 DIAGNOSIS — M21619 Bunion of unspecified foot: Secondary | ICD-10-CM

## 2022-02-21 NOTE — Progress Notes (Signed)
  Chief Complaint  Rebekah Atkins presents with   Bunions    Bilateral bunion discomfort x 3 years left over right.      HPI: Rebekah Atkins is 41 y.o. female who presents today for bunions which are slowly getting more painful left greater than right foot. She has had this discomfort for going on 3 years.  She relates she is starting to have more pain with shoes and with walking.  She is interested in knowing options for treating bunions both conservatively and surgically.   There are no problems to display for this Rebekah Atkins.   Current Outpatient Medications on File Prior to Visit  Medication Sig Dispense Refill   Prenatal Vit-Fe Fumarate-FA (PRENATAL MULTIVITAMIN) TABS Take 1 tablet by mouth every morning.     No current facility-administered medications on file prior to visit.    No Known Allergies  Review of Systems No fevers, chills, nausea, muscle aches, no difficulty breathing, no calf pain, no chest pain or shortness of breath.   Physical Exam  GENERAL APPEARANCE: Alert, conversant. Appropriately groomed. No acute distress.   VASCULAR: Pedal pulses palpable 2/4 DP and 2/4 PT bilateral.  Capillary refill time is immediate to all digits,  Proximal to distal cooling it warm to warm.  Digital perfusion adequate.   NEUROLOGIC: sensation is intact to 5.07 monofilament at 5/5 sites bilateral.  Light touch is intact bilateral, vibratory sensation intact bilateral  MUSCULOSKELETAL: acceptable muscle strength, tone and stability bilateral.  Notable bunion deformity is present right over left range of motion of the first metatarsophalangeal joint is adequate bilateral.  Moderate discomfort is noted with dorsiflexion plantarflexion of the first metatarsal phalangeal joint bilateral.  DERMATOLOGIC: skin is warm, supple, and dry.  Color, texture, and turgor of skin within normal limits.  No open wounds are noted.  No preulcerative lesions are seen.  Digital nails are asymptomatic.    X-ray evaluation  3 views of the right foot are obtained.  Moderate bunion deformity is noted with lateral deviation of the hallux on the first metatarsal head.  Increased IM angle is noted consistent with bunion deformity.  X-ray evaluation 3 views of the left foot are obtained.  Minimal bunion deformity is noted.  Slight lateral deviation of the hallux and first metatarsal is noted.  Minimal increased first IM angle is noted consistent with bunion deformity.  Assessment     ICD-10-CM   1. Bilateral bunions  M21.611 DG Foot 2 Views Right   M21.612 DG Foot 2 Views Left    CANCELED: DG Foot 2 Views Left       Plan  Discussed x-ray findings and exam findings with the Rebekah Atkins.  She does have more pain on left foot versus right foot despite the bunion being worse on the right.  We discussed conservative treatments including shoe gear changes and orthotics.  Discussed that ultimately to have these fixed she would require surgical correction.  She lives in [place] and therefore I advised her to make a preoperative consult appointment with Dr. Ardelle Anton to discuss her options for potential surgical intervention.  Briefly discussed the potential postop course.  She will follow-up with him in the future and if any problems or concerns arise prior to her visit she will call.

## 2022-02-21 NOTE — Patient Instructions (Signed)
Shoes I recommend are [employer] Adrenaline or Glycerin or [employer] branded shoes-  they have both at [employer] you can try.  [employer] in [place] is great at helping you choose shoes and get you fitted for the right shoe for your feet.  Dr Ardelle Anton in [place] is who I recommend you see for your feet-  he is great!

## 2022-03-06 ENCOUNTER — Encounter (HOSPITAL_BASED_OUTPATIENT_CLINIC_OR_DEPARTMENT_OTHER): Payer: Self-pay | Admitting: Emergency Medicine

## 2022-03-06 ENCOUNTER — Emergency Department (HOSPITAL_BASED_OUTPATIENT_CLINIC_OR_DEPARTMENT_OTHER)
Admission: EM | Admit: 2022-03-06 | Discharge: 2022-03-06 | Discharge status: Against Medical Advice | Payer: Commercial Managed Care - HMO | Attending: Emergency Medicine | Admitting: Emergency Medicine

## 2022-03-06 ENCOUNTER — Emergency Department (HOSPITAL_COMMUNITY)
Admission: EM | Admit: 2022-03-06 | Discharge: 2022-03-07 | Disposition: A | Discharge status: Home / Self Care | Payer: Commercial Managed Care - HMO | Source: Home / Self Care | Attending: Emergency Medicine | Admitting: Emergency Medicine

## 2022-03-06 ENCOUNTER — Other Ambulatory Visit: Payer: Self-pay

## 2022-03-06 DIAGNOSIS — Z5321 Procedure and treatment not carried out due to patient leaving prior to being seen by health care provider: Secondary | ICD-10-CM | POA: Diagnosis not present

## 2022-03-06 DIAGNOSIS — R0602 Shortness of breath: Secondary | ICD-10-CM | POA: Diagnosis not present

## 2022-03-06 DIAGNOSIS — K047 Periapical abscess without sinus: Secondary | ICD-10-CM | POA: Insufficient documentation

## 2022-03-06 DIAGNOSIS — R519 Headache, unspecified: Secondary | ICD-10-CM | POA: Diagnosis present

## 2022-03-06 NOTE — ED Triage Notes (Signed)
Patient arrived via POV c/o left sided head pain with shortness of breath x 2 days. Patient states wisdom tooth extraction bilaterally x 1 week with complication to left site. Patient states infection, treatment with antibiotics. Patient states reaction to antibiotic, changed to differing antibiotic. Patient last took tylenol x 3 hrs pta. Patient is AO x 4, VS WDL, normal gait.

## 2022-03-06 NOTE — ED Triage Notes (Addendum)
Patient arrived stating she had her wisdom teeth removed and got an infection on the left side. States she is currently on antibiotics. Reports increased pain around the jawline moving up to her head and a low grade fever.

## 2022-03-07 ENCOUNTER — Encounter (HOSPITAL_COMMUNITY): Payer: Self-pay

## 2022-03-07 ENCOUNTER — Emergency Department (HOSPITAL_COMMUNITY): Payer: Commercial Managed Care - HMO

## 2022-03-07 LAB — COMPREHENSIVE METABOLIC PANEL
ALT: 23 U/L (ref 0–44)
AST: 17 U/L (ref 15–41)
Albumin: 3.9 g/dL (ref 3.5–5.0)
Alkaline Phosphatase: 45 U/L (ref 38–126)
Anion gap: 6 (ref 5–15)
BUN: 9 mg/dL (ref 6–20)
CO2: 27 mmol/L (ref 22–32)
Calcium: 9.5 mg/dL (ref 8.9–10.3)
Chloride: 107 mmol/L (ref 98–111)
Creatinine, Ser: 0.71 mg/dL (ref 0.44–1.00)
GFR, Estimated: >60 mL/min (ref >60)
Glucose, Bld: 100 mg/dL — ABNORMAL HIGH (ref 70–99)
Potassium: 3.7 mmol/L (ref 3.5–5.1)
Sodium: 140 mmol/L (ref 135–145)
Total Bilirubin: 0.5 mg/dL (ref 0.3–1.2)
Total Protein: 7.1 g/dL (ref 6.5–8.1)

## 2022-03-07 LAB — I-STAT BETA HCG BLOOD, ED (MC, WL, AP ONLY): I-stat hCG, quantitative: <5 m[IU]/mL (ref <5)

## 2022-03-07 LAB — CBC WITH DIFFERENTIAL/PLATELET
Abs Immature Granulocytes: 0.04 10*3/uL (ref 0.00–0.07)
Basophils Absolute: 0 10*3/uL (ref 0.0–0.1)
Basophils Relative: 0 %
Eosinophils Absolute: 0.3 10*3/uL (ref 0.0–0.5)
Eosinophils Relative: 6 %
HCT: 34.2 % — ABNORMAL LOW (ref 36.0–46.0)
Hemoglobin: 11.1 g/dL — ABNORMAL LOW (ref 12.0–15.0)
Immature Granulocytes: 1 %
Lymphocytes Relative: 34 %
Lymphs Abs: 1.7 10*3/uL (ref 0.7–4.0)
MCH: 30.4 pg (ref 26.0–34.0)
MCHC: 32.5 g/dL (ref 30.0–36.0)
MCV: 93.7 fL (ref 80.0–100.0)
Monocytes Absolute: 0.5 10*3/uL (ref 0.1–1.0)
Monocytes Relative: 10 %
Neutro Abs: 2.4 10*3/uL (ref 1.7–7.7)
Neutrophils Relative %: 49 %
Platelets: 315 10*3/uL (ref 150–400)
RBC: 3.65 MIL/uL — ABNORMAL LOW (ref 3.87–5.11)
RDW: 12.1 % (ref 11.5–15.5)
WBC: 4.8 10*3/uL (ref 4.0–10.5)
nRBC: 0 % (ref 0.0–0.2)

## 2022-03-07 LAB — TROPONIN I (HIGH SENSITIVITY)
Troponin I (High Sensitivity): <2 ng/L (ref <18)
Troponin I (High Sensitivity): <2 ng/L (ref <18)

## 2022-03-07 LAB — TSH: TSH: 2.45 u[IU]/mL (ref 0.350–4.500)

## 2022-03-07 MED ORDER — CLINDAMYCIN HCL 300 MG PO CAPS
300.0000 mg | ORAL_CAPSULE | Freq: Once | ORAL | Status: AC
  Administered 2022-03-07: 300 mg via ORAL
  Filled 2022-03-07: qty 1

## 2022-03-07 MED ORDER — HYDROCODONE-ACETAMINOPHEN 5-325 MG PO TABS
1.0000 | ORAL_TABLET | Freq: Once | ORAL | Status: AC
  Administered 2022-03-07: 1 via ORAL
  Filled 2022-03-07: qty 1

## 2022-03-07 MED ORDER — IOHEXOL 300 MG/ML  SOLN
75.0000 mL | Freq: Once | INTRAMUSCULAR | Status: AC | PRN
  Administered 2022-03-07: 75 mL via INTRAVENOUS

## 2022-03-07 MED ORDER — CLINDAMYCIN HCL 300 MG PO CAPS: 300.0000 mg | ORAL_CAPSULE | Freq: Three times a day (TID) | ORAL | 0 refills | Status: AC

## 2022-03-07 MED ORDER — KETOROLAC TROMETHAMINE 15 MG/ML IJ SOLN
15.0000 mg | Freq: Once | INTRAMUSCULAR | Status: AC
  Administered 2022-03-07: 15 mg via INTRAVENOUS
  Filled 2022-03-07: qty 1

## 2022-03-07 MED ORDER — SODIUM CHLORIDE (PF) 0.9 % IJ SOLN
INTRAMUSCULAR | Status: AC
  Filled 2022-03-07: qty 50

## 2022-03-07 NOTE — ED Provider Notes (Signed)
[facility] Provider Note   CSN: [id number] Arrival date & time: 03/06/22  2355     History  Chief Complaint  Patient presents with   Dental Problem    Rebekah Atkins is a 41 y.o. female who presents with concern for left-sided facial pain and swelling now radiating to the back of her head worsening.  8 days status post mandibular wisdom tooth extraction in the outpatient setting with subsequent left mandibular infection treated initially with Augmentin x2 days, however patient developed palpitations.  Antibiotic choice transitioned to amoxicillin by oral surgery office which patient has continued to take.  She does not have any pain in her mouth at this time pain primarily beneath the left ear and radiating to the back of the head, left-sided facial swelling.  Subjective fevers at home, intermittent chest tightness and palpitations.  No history of the same.  I personally read this patient's medical records.  She does not carry medical diagnoses nor standard medications daily.  HPI     Home Medications Prior to Admission medications   Medication Sig Start Date End Date Taking? Authorizing Provider  acetaminophen (TYLENOL) 500 MG tablet Take 1,000 mg by mouth every 8 (eight) hours as needed for moderate pain or fever.   Yes [provider]  amoxicillin (AMOXIL) 500 MG capsule Take 500 mg by mouth 3 (three) times daily. 03/04/22  Yes [provider]  clindamycin (CLEOCIN) 300 MG capsule Take 1 capsule (300 mg total) by mouth 3 (three) times daily for 10 days. 03/07/22 03/17/22 Yes Jolyne Laye R, PA-C  ibuprofen (ADVIL) 600 MG tablet Take 600 mg by mouth every 6 (six) hours. 02/27/22  Yes [provider]  chlorhexidine (PERIDEX) 0.12 % solution 30 mLs by Mouth Rinse route 2 (two) times daily. 02/27/22   [provider]      Allergies    Tetracycline and Doxycycline    Review of Systems   Review of Systems   Constitutional:  Positive for fever. Negative for chills.  HENT:  Positive for dental problem, ear pain and facial swelling.   Eyes: Negative.   Respiratory:  Positive for shortness of breath.   Cardiovascular:  Positive for chest pain and palpitations.  Neurological:  Positive for headaches.    Physical Exam Updated Vital Signs BP 110/69   Pulse 70   Temp 98.1 F (36.7 C) (Oral)   Resp 13   Ht 5\' 8"  (1.727 m)   Wt 68 kg   LMP 02/12/2022 Comment: negative beta HCG 03/07/22  SpO2 97%   BMI 22.81 kg/m  Physical Exam Vitals and nursing note reviewed.  Constitutional:      Appearance: She is not ill-appearing or toxic-appearing.  HENT:     Head: Normocephalic and atraumatic.      Nose: Nose normal.     Mouth/Throat:     Mouth: Mucous membranes are moist.     Pharynx: Oropharynx is clear. Uvula midline. No oropharyngeal exudate or posterior oropharyngeal erythema.     Tonsils: No tonsillar exudate.   Eyes:     General: Lids are normal. Vision grossly intact.        Right eye: No discharge.        Left eye: No discharge.     Conjunctiva/sclera: Conjunctivae normal.     Pupils: Pupils are equal, round, and reactive to light.  Neck:     Trachea: Trachea and phonation normal.  Cardiovascular:     Rate and Rhythm:  Normal rate and regular rhythm.     Pulses: Normal pulses.     Heart sounds: Normal heart sounds. No murmur heard. Pulmonary:     Effort: Pulmonary effort is normal. No respiratory distress.     Breath sounds: Normal breath sounds. No wheezing or rales.  Abdominal:     General: Bowel sounds are normal. There is no distension.     Palpations: Abdomen is soft.     Tenderness: There is no abdominal tenderness. There is no guarding or rebound.  Musculoskeletal:        General: No deformity.     Cervical back: Normal range of motion and neck supple.     Right lower leg: No edema.     Left lower leg: No edema.  Lymphadenopathy:     Cervical: No cervical  adenopathy.  Skin:    General: Skin is warm and dry.     Capillary Refill: Capillary refill takes less than 2 seconds.  Neurological:     General: No focal deficit present.     Mental Status: She is alert and oriented to person, place, and time. Mental status is at baseline.  Psychiatric:        Mood and Affect: Mood normal.     ED Results / Procedures / Treatments   Labs (all labs ordered are listed, but only abnormal results are displayed) Labs Reviewed  CBC WITH DIFFERENTIAL/PLATELET - Abnormal; Notable for the following components:      Result Value   RBC 3.65 (*)    Hemoglobin 11.1 (*)    HCT 34.2 (*)    All other components within normal limits  COMPREHENSIVE METABOLIC PANEL - Abnormal; Notable for the following components:   Glucose, Bld 100 (*)    All other components within normal limits  TSH  I-STAT BETA HCG BLOOD, ED ([facility], [facility], [facility] ONLY)  TROPONIN I (HIGH SENSITIVITY)  TROPONIN I (HIGH SENSITIVITY)    EKG None  Radiology CT Soft Tissue Neck W Contrast  Result Date: 03/07/2022 CLINICAL DATA:  Initial evaluation for soft tissue swelling, infection suspected, recent tooth extraction. EXAM: CT NECK WITH CONTRAST TECHNIQUE: Multidetector CT imaging of the neck was performed using the standard protocol following the bolus administration of intravenous contrast. RADIATION DOSE REDUCTION: This exam was performed according to the departmental dose-optimization program which includes automated exposure control, adjustment of the mA and/or kV according to patient size and/or use of iterative reconstruction technique. CONTRAST:  90mL OMNIPAQUE IOHEXOL 300 MG/ML  SOLN COMPARISON:  None Available. FINDINGS: Pharynx and larynx: Sequelae of recent mandibular wisdom tooth extraction bilaterally. Asymmetric soft tissue swelling with inflammatory stranding seen about the left mandibular body and adjacent left face, concerning for infection/cellulitis. Changes primarily involve the left  masticator and submandibular spaces. Few foci of soft tissue emphysema within the left parapharyngeal space noted, likely postoperative. Question small abscess along the buccal aspect of the left mandibular body emanating from the tooth extraction site, measuring up to 1.2 cm (series 4, image 49). No other drainable fluid collection. Oropharynx and nasopharynx within normal limits. No retropharyngeal collection or swelling. Negative epiglottis. Vallecula clear. Hypopharynx and supraglottic larynx within normal limits. Negative glottis. Subglottic airway patent clear. Salivary glands: Parotid glands within normal limits. Normal right submandibular gland. Mild inflammatory stranding surrounds the left submandibular gland related to the inflammatory process within the adjacent left face. Thyroid: Normal. Lymph nodes: No enlarged or pathologic adenopathy seen within the neck. Vascular: Normal intravascular enhancement seen throughout the neck.  Limited intracranial: Unremarkable. Visualized orbits: Unremarkable. Mastoids and visualized paranasal sinuses: Visualized paranasal sinuses are clear. Visualized mastoids and middle ear cavities are well pneumatized and free of fluid. Skeleton: No discrete or worrisome osseous lesions. Mild spondylosis noted at C5-6 and C6-7. Upper chest: Visualized upper chest demonstrates no acute finding. Other: None. IMPRESSION: Sequelae of recent mandibular wisdom tooth extraction. Asymmetric soft tissue swelling with inflammatory stranding about the left mandibular body and adjacent left face, concerning for acute infection/cellulitis given provided history. Question associated 1.2 cm abscess emanating from the tooth extraction site along the buccal aspect of the left mandibular body as above. Electronically Signed   By: Rise Mu M.D.   On: 03/07/2022 03:29    Procedures Procedures    Medications Ordered in ED Medications  sodium chloride (PF) 0.9 % injection (  Not  Given 03/07/22 0308)  ketorolac (TORADOL) 15 MG/ML injection 15 mg (15 mg Intravenous Given 03/07/22 0139)  iohexol (OMNIPAQUE) 300 MG/ML solution 75 mL (75 mLs Intravenous Contrast Given 03/07/22 0240)  HYDROcodone-acetaminophen (NORCO/VICODIN) 5-325 MG per tablet 1 tablet (1 tablet Oral Given 03/07/22 0454)  clindamycin (CLEOCIN) capsule 300 mg (300 mg Oral Given 03/07/22 0454)    ED Course/ Medical Decision Making/ A&P                           Medical Decision Making 41 year old female who presents with concern for facial swelling, palpitations in context of recent wisdom teeth extraction.   VS normal on intake, cardiopulmonary exam is normal, abdominal exam is benign. Oropharyngeal exam as above, no oropharyngeal abscess on exaam. Left sided facial swelling extendign beneath the left ear. PERC negative.   Amount and/or Complexity of Data Reviewed Labs: ordered.    Details: CBC without leukocytosis but with mild anemia with hemoglobin of 11.  CMP unremarkable, troponin negative.  TSH is normal.  Patient is not pregnant. Radiology: ordered.    Details: CT soft tissue of the neck with question of 1.2 cm abscess emanating from tooth extraction site in the left mandible with adjacent buccal mucosal changes, no evidence of deep space infection or larger abscess that would require drainage.    Risk Prescription drug management.   Ultimately patient's work-up is very reassuring.  Suspect poor coverage for oral microbiome with amoxicillin alone which is what she is currently taking in the outpatient setting.  We will transition her to clindamycin and recommend she follow-up closely with her dentist.  Patient tolerating p.o., well-appearing with normal hemodynamics.  No further work-up warranted in the ER at this time.  Clinical concern for emergent underlying etiology that warrant further ED work-up or inpatient management is exceedingly low.  Rebekah Atkins  voiced understanding of her medical evaluation  and treatment plan. Each of their questions answered to their expressed satisfaction.  Return precautions were given.  Patient is well-appearing, stable, and was discharged in good condition.  This chart was dictated using voice recognition software, Dragon. Despite the best efforts of this provider to proofread and correct errors, errors may still occur which can change documentation meaning.  Final Clinical Impression(s) / ED Diagnoses Final diagnoses:  Dental infection    Rx / DC Orders ED Discharge Orders          Ordered    clindamycin (CLEOCIN) 300 MG capsule  3 times daily        03/07/22 0445  Paris Lore, PA-C 03/07/22 0703    Sloan Leiter, DO 03/07/22 2313

## 2022-03-07 NOTE — Discharge Instructions (Addendum)
You were seen in the ER today for your facial swelling and pain. Your CT scan was overall reassuring without dangerous spread of your dental infection, though your infection was once again identified. Please take entire course of antibiotics as directed.  Continue using ibuprofen / Tylenol, as well as prescribed Orajel for pain.  You will need to follow-up with your dentist for continued management of this. Please see dental resources below. Return to the emergency department for fevers, swelling or pain under the tongue or in the neck, difficulty breathing or swallowing, nausea or vomiting that does not stop, or any other new or concerning symptoms.  Regarding your palpitations, there was no dangerous changes  identified on your workup today. You may follow up with the cardiologist listed below.

## 2022-03-27 ENCOUNTER — Ambulatory Visit: Payer: Commercial Managed Care - HMO | Admitting: Podiatry

## 2022-03-27 DIAGNOSIS — M21611 Bunion of right foot: Secondary | ICD-10-CM | POA: Diagnosis not present

## 2022-03-27 DIAGNOSIS — M21612 Bunion of left foot: Secondary | ICD-10-CM

## 2022-03-27 NOTE — Progress Notes (Signed)
Subjective: Chief Complaint  Patient presents with   Bunions    Patient came in today for bilateral bunions, right hurts worse than the left, Started 3 years ago, Rate of pain 7 out of 88,    41 year old female presents the above concerns.  She states she been having bunions for quite some time but seems to be getting worse.  She tried changing shoes off without significant improvement.  She presents today for surgical consultation.  Objective: AAO x3, NAD DP/PT pulses palpable bilaterally, CRT less than 3 seconds Mild severe bunion present on right foot and to a lesser degree in the left foot.  On the right foot there is hypermobility of the first ray but not on the left.  There is tenderness within the bunion site bilaterally.  MMT 5/5.  No crepitation or restriction with MPJ range of motion bilaterally.  No other areas of discomfort.  MMT 5/5. No pain with calf compression, swelling, warmth, erythema  Assessment: 41 year old female with symptomatic bunions  Plan: -All treatment options discussed with the patient including all alternatives, risks, complications.  -Reviewed the x-rays with her.  3 views bilateral feet were reviewed.  To severe bunion present on the right foot more moderate of the left foot with intermetatarsal angle.  No evidence of acute fracture. -Discussed both conservative as well as surgical treatment options.  After discussion/proceed with surgical intervention. -Will start with the left, Austin bunionectomy. Will do the right later most likely consist of a Lapisus, Akin.  -The incision placement as well as the postoperative course was discussed with the patient. I discussed risks of the surgery which include, but not limited to, infection, bleeding, pain, swelling, need for further surgery, delayed or nonhealing, painful or ugly scar, numbness or sensation changes, over/under correction, recurrence, transfer lesions, further deformity, hardware failure, DVT/PE, loss of  toe/foot. Patient understands these risks and wishes to proceed with surgery. The surgical consent was reviewed with the patient all 3 pages were signed. No promises or guarantees were given to the outcome of the procedure. All questions were answered to the best of my ability. Before the surgery the patient was encouraged to call the office if there is any further questions. The surgery will be performed at the [facility] on an outpatient basis. -Boot dispensed for postoperative use for immobilization. -Patient encouraged to call the office with any questions, concerns, change in symptoms.   Trula Slade DPM

## 2022-03-28 ENCOUNTER — Encounter: Payer: Commercial Managed Care - HMO | Admitting: Advanced Practice Midwife

## 2022-04-18 ENCOUNTER — Other Ambulatory Visit (HOSPITAL_COMMUNITY)
Admission: RE | Admit: 2022-04-18 | Discharge: 2022-04-18 | Disposition: A | Discharge status: Home / Self Care | Payer: Commercial Managed Care - HMO | Source: Ambulatory Visit | Attending: Advanced Practice Midwife | Admitting: Advanced Practice Midwife

## 2022-04-18 ENCOUNTER — Ambulatory Visit (INDEPENDENT_AMBULATORY_CARE_PROVIDER_SITE_OTHER): Payer: Commercial Managed Care - HMO | Admitting: Advanced Practice Midwife

## 2022-04-18 VITALS — BP 114/58 | HR 71 | Wt 151.0 lb

## 2022-04-18 DIAGNOSIS — Z124 Encounter for screening for malignant neoplasm of cervix: Secondary | ICD-10-CM | POA: Diagnosis present

## 2022-04-18 DIAGNOSIS — N920 Excessive and frequent menstruation with regular cycle: Secondary | ICD-10-CM | POA: Diagnosis not present

## 2022-04-18 DIAGNOSIS — N939 Abnormal uterine and vaginal bleeding, unspecified: Secondary | ICD-10-CM | POA: Insufficient documentation

## 2022-04-18 DIAGNOSIS — Z01419 Encounter for gynecological examination (general) (routine) without abnormal findings: Secondary | ICD-10-CM | POA: Diagnosis not present

## 2022-04-18 NOTE — Progress Notes (Signed)
Subjective:     Rebekah Atkins is a 41 y.o. female here at [facility] for a routine exam.  Current complaints: heavy menses since menarche, but her cycles have become shorter with more frequent bleeding in the last couple of years. Cycles are regular at around 21 days currently.  She has hx anemia and takes oral iron.  Personal health questionnaire reviewed: yes.  Do you have a primary care provider? yes Do you feel safe at home? yes    Health Maintenance Due  Topic Date Due   Hepatitis C Screening  Never done   TETANUS/TDAP  Never done   PAP SMEAR-Modifier  Never done   INFLUENZA VACCINE  Never done     Risk factors for chronic health problems: Smoking: never Alchohol/how much: social Pt BMI: Body mass index is 22.96 kg/m.   Gynecologic History Patient's last menstrual period was 03/29/2022 (approximate). Contraception: coitus interruptus Last Pap: 2018 or 2019. Results were: normal per pt Last mammogram: 12/2021. Results were: normal  Obstetric History OB History  Gravida Para Term Preterm AB Living  1 1 1  0 0 1  SAB IAB Ectopic Multiple Live Births  0 0 0 0 1    # Outcome Date GA Lbr Len/2nd Weight Sex Delivery Anes PTL Lv  1 Term 01/23/12 [redacted]w[redacted]d 08:32 / 00:18 7 lb 4.2 oz (3.294 kg) M Vag-Vacuum Pud  LIV     Birth Comments: none     The following portions of the patient's history were reviewed and updated as appropriate: allergies, current medications, past family history, past medical history, past social history, past surgical history, and problem list.  Review of Systems Pertinent items noted in HPI and remainder of comprehensive ROS otherwise negative.    Objective:   BP (!) 114/58   Pulse 71   Wt 151 lb (68.5 kg)   LMP 03/29/2022 (Approximate)   BMI 22.96 kg/m  VS reviewed, nursing note reviewed,  Constitutional: well developed, well nourished, no distress HEENT: normocephalic CV: normal rate Pulm/chest wall: normal effort Breast Exam:   exam performed: right breast normal without mass, skin or nipple changes or axillary nodes, left breast normal without mass, skin or nipple changes or axillary nodes Abdomen: soft Neuro: alert and oriented x 3 Skin: warm, dry Psych: affect normal Pelvic exam: Performed: Cervix pink, visually closed, without lesion, scant white creamy discharge, vaginal walls and external genitalia normal Bimanual exam: Cervix 0/long/high, firm, anterior, neg CMT, uterus nontender, nonenlarged, adnexa without tenderness, enlargement, or mass       Assessment/Plan:   1. Well woman exam with routine gynecological exam --Mammogram 12/2021 wnl, has primary care and is up to date on other health maintenance.  2. Screening for cervical cancer --Last Pap 4-5 years ago - Cytology - PAP( Rebekah Atkins)  3. Abnormal uterine bleeding (AUB) --Pt with hx heavy menses, 26 day cycle but cycles have become shorter, closer to 21 days and continue to be heavy --Pt has anemia and takes oral iron but feels like she can't get on top of the anemia with heavy periods this often. --She has dx Hashimoto's and is hesitant to use hormones, as these have caused problems for her related to her thyroid and side effects in the past. --She has heard about ablation and is interested in discussing the procedure. --She uses withdrawal for contraception and does not plan any future pregnancies.   4. Menorrhagia with regular cycle       Return for MD only,  surgical consult for ablation.   Rebekah Atkins, CNM 5:08 PM

## 2022-04-24 ENCOUNTER — Encounter (HOSPITAL_BASED_OUTPATIENT_CLINIC_OR_DEPARTMENT_OTHER): Payer: Self-pay | Admitting: Advanced Practice Midwife

## 2022-04-24 LAB — CYTOLOGY - PAP
Adequacy: ABSENT
Comment: NEGATIVE
Diagnosis: NEGATIVE
High risk HPV: NEGATIVE

## 2022-05-01 ENCOUNTER — Telehealth: Payer: Self-pay | Admitting: Podiatry

## 2022-05-01 NOTE — Telephone Encounter (Signed)
DOS: 05/31/2022  [employer]  Procedure: Austin Bunionectomy Lt [phone])  DX: M20.12  Deductible: $0 Out-of-Pocket: $1,700 with $748.13 remaining CoInsurance: 25%  Prior Authorization is Not required per [facility] automated system.  Call Reference #: [id number]

## 2022-05-16 ENCOUNTER — Telehealth (HOSPITAL_BASED_OUTPATIENT_CLINIC_OR_DEPARTMENT_OTHER): Payer: Self-pay | Admitting: Advanced Practice Midwife

## 2022-05-16 NOTE — Telephone Encounter (Signed)
Called patient  and left a message to please call the office back to set up appointment  with Dr.Miller . 

## 2022-05-29 ENCOUNTER — Telehealth: Payer: Self-pay

## 2022-05-29 NOTE — Telephone Encounter (Signed)
Rebekah Atkins called to cancel her surgery with Dr. Ardelle Anton on 05/31/2022. She stated she wants to wait till summer to have her surgery. I have her scheduled for a consult in May 2024 to talk to Dr. Ardelle Anton again and schedule surgery at that time. Notified Dr. Ardelle Anton and Aram Beecham at [facility].

## 2022-05-30 ENCOUNTER — Other Ambulatory Visit (HOSPITAL_BASED_OUTPATIENT_CLINIC_OR_DEPARTMENT_OTHER): Payer: Self-pay | Admitting: Advanced Practice Midwife

## 2022-06-05 ENCOUNTER — Other Ambulatory Visit: Payer: Commercial Managed Care - HMO

## 2022-06-05 ENCOUNTER — Encounter: Payer: Commercial Managed Care - HMO | Admitting: Podiatry

## 2022-06-15 ENCOUNTER — Other Ambulatory Visit: Payer: Commercial Managed Care - HMO

## 2022-06-15 ENCOUNTER — Encounter: Payer: Commercial Managed Care - HMO | Admitting: Podiatry

## 2022-06-29 ENCOUNTER — Encounter: Payer: Commercial Managed Care - HMO | Admitting: Podiatry

## 2022-07-28 ENCOUNTER — Institutional Professional Consult (permissible substitution) (HOSPITAL_BASED_OUTPATIENT_CLINIC_OR_DEPARTMENT_OTHER): Payer: Commercial Managed Care - HMO | Admitting: Obstetrics & Gynecology

## 2022-09-01 ENCOUNTER — Encounter: Payer: Self-pay | Admitting: Certified Nurse Midwife

## 2022-09-01 ENCOUNTER — Other Ambulatory Visit: Payer: Self-pay

## 2022-09-01 ENCOUNTER — Ambulatory Visit: Payer: Medicaid Other | Admitting: Certified Nurse Midwife

## 2022-09-01 VITALS — BP 109/47 | HR 72 | Wt 150.4 lb

## 2022-09-01 DIAGNOSIS — E063 Autoimmune thyroiditis: Secondary | ICD-10-CM | POA: Insufficient documentation

## 2022-09-01 NOTE — Patient Instructions (Addendum)
Nordic Naturals DHA '830mg'$  5000IU per day of vitamin D Red yeast rice for cholesterol

## 2022-09-01 NOTE — Progress Notes (Signed)
  History:  Ms. AMEYALLI ELICKER is a 42 y.o. G1P1001 who presents to clinic today for to discuss shortening cycles with heavy bleeding. Has an appointment with Dr. Sabra Heck next week to discuss an ablation. But wants to discuss what feels like a hormonal imbalance. Has been diagnosed with Hashimoto's due to presence of anti-thyroid immunoglobulins, but TSH has been normal so she has not been treated. Still experiencing: heavy cycle lasting 4-7 days that is very heavy, acne, fatigue, brain fog, difficulty concentrating, insomnia at 3-4am each morning with racing thoughts, constipation, high cholesterol, cold intolerance, swelling in lower extremities and joint pain.   Has tried the AIP diet (with some decrease in symptoms but not completely eased). Takes zinc and selenium to help thyroid function, a probiotic and l-glutamine for gut health.     The following portions of the patient's history were reviewed and updated as appropriate: allergies, current medications, family history, past medical history, social history, past surgical history and problem list.  Review of Systems:  Pertinent items noted in HPI and remainder of comprehensive ROS otherwise negative.   Objective:  Physical Exam BP (!) 109/47   Pulse 72   Wt 150 lb 6.4 oz (68.2 kg)   BMI 22.87 kg/m  Physical Exam Constitutional:      General: She is not in acute distress.    Appearance: Normal appearance. She is normal weight. She is not ill-appearing.  Cardiovascular:     Rate and Rhythm: Normal rate and regular rhythm.  Pulmonary:     Effort: Pulmonary effort is normal.  Abdominal:     Palpations: Abdomen is soft.  Musculoskeletal:        General: Normal range of motion.  Skin:    General: Skin is warm and dry.     Capillary Refill: Capillary refill takes less than 2 seconds.  Neurological:     Mental Status: She is alert and oriented to person, place, and time.  Psychiatric:        Mood and Affect: Mood normal.         Behavior: Behavior normal.   Labs and Imaging No results found for this or any previous visit (from the past 24 hour(s)).  No results found.   Assessment & Plan:  1. Hashimoto's disease - Pt strongly desires to get to root of why she has this autoimmune disorder and understand how it affects her hormones - Discussed how vitamin d deficiency can be a cause of thyroid malfunction - Discussed connection between thyroid function and uterine health - Suggested additional supplements to try (see AVS) - To keep appt with Dr. Sabra Heck for ablation consult  - CBC - Vitamin D (25 hydroxy) - Thyroid Panel With TSH - Hormone Panel - Cortisol  Follow up PRN.  Gabriel Carina, [place] 09/01/2022 9:57 PM

## 2022-09-05 ENCOUNTER — Ambulatory Visit (INDEPENDENT_AMBULATORY_CARE_PROVIDER_SITE_OTHER): Payer: Medicaid Other | Admitting: Obstetrics & Gynecology

## 2022-09-05 ENCOUNTER — Encounter (HOSPITAL_BASED_OUTPATIENT_CLINIC_OR_DEPARTMENT_OTHER): Payer: Self-pay | Admitting: Obstetrics & Gynecology

## 2022-09-05 VITALS — BP 105/67 | HR 63 | Ht 67.5 in | Wt 150.4 lb

## 2022-09-05 DIAGNOSIS — N920 Excessive and frequent menstruation with regular cycle: Secondary | ICD-10-CM

## 2022-09-05 NOTE — Progress Notes (Signed)
GYNECOLOGY  VISIT  CC:   discuss possible surgery  HPI: 42 y.o. G4P1001 Married White or Caucasian female here for discussion of possible surgical treatment of menorrhagia.  Cycles are about every three weeks now.  Flow is between 4-6 days.  Most of the days are heavy with needs pads/tampons with frequent changing.  Does leak through at night.   On liquid iron.  H/o anemia.  Hb 03/07/2022 was 11.1.  Did have normal pap smear on 04/18/2022.  Options reviewed including endometrial ablation, IUD use.  She is not interested in any more invasive surgery.  If decides to proceed with ablation, recommended also proceeding with salpingectomy as future pregnancy is not recommended.  Procedure discussed with patient.  Recovery and pain management discussed.  Risks discussed including but not limited to bleeding, rare risk of transfusion, infection, 1% risk of uterine perforation with risks of fluid deficit causing cardiac arrythmia, cerebral swelling and/or need to stop procedure early.  Fluid emboli and rare risk of death discussed.  DVT/PE, rare risk of risk of bowel/bladder/ureteral/vascular injury.  Failure rate also discussed.  All questions answered.     Past Medical History:  Diagnosis Date   No pertinent past medical history     MEDS:   Current Outpatient Medications on File Prior to Visit  Medication Sig Dispense Refill   acetaminophen (TYLENOL) 500 MG tablet Take 1,000 mg by mouth every 8 (eight) hours as needed for moderate pain or fever.     ibuprofen (ADVIL) 600 MG tablet Take 600 mg by mouth every 6 (six) hours.     No current facility-administered medications on file prior to visit.    ALLERGIES: Augmentin [amoxicillin-pot clavulanate], Tetracycline, and Doxycycline  SH:  married, non smoker  Review of Systems  Constitutional: Negative.   Genitourinary:        Menorrhagia    PHYSICAL EXAMINATION:    BP 105/67   Pulse 63   Ht 5' 7.5" (1.715 m)   Wt 150 lb 6.4 oz (68.2 kg)    LMP 08/17/2022 (Exact Date)   Breastfeeding No   BMI 23.21 kg/m     General appearance: alert, cooperative and appears stated age No physical exam performed today  Assessment/Plan: 1. Menorrhagia with regular cycle - recommended proceeding with evaluation including ultrasound and possible biopsy depending on ultrasound results.  Will obtained endometrial sampling in the OR if we do not do a biopsy prior to surgery. - she is going to consider bilateral salpingectomy as well - US PELVIS TRANSVAGINAL NON-OB (TV ONLY); Future  Total time with pt:  26 minutes  Time with documentation:  4 minutes.  Total time:  30 minutes

## 2022-09-11 ENCOUNTER — Encounter (HOSPITAL_BASED_OUTPATIENT_CLINIC_OR_DEPARTMENT_OTHER): Payer: Self-pay

## 2022-09-14 LAB — HORMONE PANEL (T4,TSH,FSH,TESTT,SHBG,DHEA,ETC)
DHEA-Sulfate, LCMS: 78 ug/dL
Estradiol, Serum, MS: 129 pg/mL
Estrone Sulfate: 73 ng/dL
Follicle Stimulating Hormone: 5.1 m[IU]/mL
Free T-3: 2.8 pg/mL
Free Testosterone, Serum: 1.4 pg/mL
Progesterone, Serum: 965 ng/dL
Sex Hormone Binding Globulin: 142 nmol/L — ABNORMAL HIGH
T4: 7.6 ug/dL
TSH: 1.4 uU/mL
Testosterone, Serum (Total): 23 ng/dL
Testosterone-% Free: 0.6 %
Triiodothyronine (T-3), Serum: 91 ng/dL

## 2022-09-14 LAB — CBC
Hematocrit: 33.7 % — ABNORMAL LOW (ref 34.0–46.6)
Hemoglobin: 11.3 g/dL (ref 11.1–15.9)
MCH: 30.5 pg (ref 26.6–33.0)
MCHC: 33.5 g/dL (ref 31.5–35.7)
MCV: 91 fL (ref 79–97)
Platelets: 309 10*3/uL (ref 150–450)
RBC: 3.71 x10E6/uL — ABNORMAL LOW (ref 3.77–5.28)
RDW: 12 % (ref 11.7–15.4)
WBC: 4.9 10*3/uL (ref 3.4–10.8)

## 2022-09-14 LAB — THYROID PANEL WITH TSH
Free Thyroxine Index: 2 (ref 1.2–4.9)
T3 Uptake Ratio: 29 % (ref 24–39)
T4, Total: 7 ug/dL (ref 4.5–12.0)
TSH: 1.4 u[IU]/mL (ref 0.450–4.500)

## 2022-09-14 LAB — VITAMIN D 25 HYDROXY (VIT D DEFICIENCY, FRACTURES): Vit D, 25-Hydroxy: 40.6 ng/mL (ref 30.0–100.0)

## 2022-09-14 LAB — CORTISOL: Cortisol: 13.4 ug/dL (ref 6.2–19.4)

## 2022-09-20 ENCOUNTER — Ambulatory Visit (INDEPENDENT_AMBULATORY_CARE_PROVIDER_SITE_OTHER): Payer: Medicaid Other

## 2022-09-20 ENCOUNTER — Ambulatory Visit (HOSPITAL_BASED_OUTPATIENT_CLINIC_OR_DEPARTMENT_OTHER): Payer: Medicaid Other | Admitting: Obstetrics & Gynecology

## 2022-09-20 ENCOUNTER — Encounter (HOSPITAL_BASED_OUTPATIENT_CLINIC_OR_DEPARTMENT_OTHER): Payer: Self-pay | Admitting: Obstetrics & Gynecology

## 2022-09-20 VITALS — BP 99/41 | HR 72 | Ht 67.0 in | Wt 154.8 lb

## 2022-09-20 DIAGNOSIS — N920 Excessive and frequent menstruation with regular cycle: Secondary | ICD-10-CM | POA: Diagnosis not present

## 2022-09-22 ENCOUNTER — Encounter (HOSPITAL_BASED_OUTPATIENT_CLINIC_OR_DEPARTMENT_OTHER): Payer: Self-pay

## 2022-09-24 ENCOUNTER — Other Ambulatory Visit (HOSPITAL_BASED_OUTPATIENT_CLINIC_OR_DEPARTMENT_OTHER): Payer: Self-pay | Admitting: Obstetrics & Gynecology

## 2022-09-24 DIAGNOSIS — L709 Acne, unspecified: Secondary | ICD-10-CM | POA: Insufficient documentation

## 2022-09-24 DIAGNOSIS — Z01818 Encounter for other preprocedural examination: Secondary | ICD-10-CM

## 2022-09-24 NOTE — Progress Notes (Signed)
GYNECOLOGY  VISIT  CC:   follow up after ultrasound  HPI: 42 y.o. G42P1001 Married White or Caucasian female here for discussion after undergoing ultrasound.  No abnormal findings noted on ultrasound.  No fibroids present.  No endometrial abnormalities noted and no evidence of adenomyosis noted.  Pt is desirous to proceed with endometrial ablation.  Surgery date has already been scheduled.  However, she has decided not to proceed with tubal ligation/excision as spouse is going to go ahead with vasectomy.  I have discussed very clearly that this surgery does not convey contraception and that she should not get pregnancy after undergoing and ablation.  She voices clear understanding.  Procedure discussed again.  Will do with hysteroscopy and D&C for endometrial sampling day of procedure.  10% failure rate discussed as well.  All questions answered.     No past medical history on file.  MEDS:   Current Outpatient Medications on File Prior to Visit  Medication Sig Dispense Refill   acetaminophen (TYLENOL) 500 MG tablet Take 1,000 mg by mouth every 8 (eight) hours as needed for moderate pain or fever.     ibuprofen (ADVIL) 600 MG tablet Take 600 mg by mouth every 6 (six) hours.     No current facility-administered medications on file prior to visit.    ALLERGIES: Augmentin [amoxicillin-pot clavulanate], Tetracycline, and Doxycycline  SH:  married, non smoker  Review of Systems  Constitutional: Negative.   Genitourinary:        Menorrhagia    PHYSICAL EXAMINATION:    BP (!) 99/41 (BP Location: Right Arm, Patient Position: Sitting, Cuff Size: Large)   Pulse 72   Ht 5\' 7"  (1.702 m) Comment: Reported  Wt 154 lb 12.8 oz (70.2 kg)   LMP 08/17/2022 (Exact Date)   BMI 24.25 kg/m     General appearance: alert, cooperative and appears stated age No other exam performed today  Assessment/Plan: 1. Menorrhagia with regular cycle - hysteroscopy with D&C and Novasure endometrial ablation is  planned.  Procedure already scheduled.  Pt's spouse is going to have vasectomy for contraception.

## 2022-09-24 NOTE — Progress Notes (Signed)
Pre-op orders placed

## 2022-10-27 ENCOUNTER — Other Ambulatory Visit (HOSPITAL_BASED_OUTPATIENT_CLINIC_OR_DEPARTMENT_OTHER): Payer: Self-pay | Admitting: Obstetrics & Gynecology

## 2022-10-27 ENCOUNTER — Other Ambulatory Visit: Payer: Self-pay | Admitting: Obstetrics and Gynecology

## 2022-10-27 MED ORDER — TRANEXAMIC ACID 650 MG PO TABS: 1300.0000 mg | ORAL_TABLET | Freq: Three times a day (TID) | ORAL | 1 refills | Status: DC

## 2022-11-06 ENCOUNTER — Encounter (HOSPITAL_BASED_OUTPATIENT_CLINIC_OR_DEPARTMENT_OTHER): Payer: Self-pay | Admitting: Obstetrics & Gynecology

## 2022-11-06 ENCOUNTER — Ambulatory Visit: Payer: Medicaid Other | Admitting: Podiatry

## 2022-11-06 DIAGNOSIS — M21612 Bunion of left foot: Secondary | ICD-10-CM | POA: Diagnosis not present

## 2022-11-06 DIAGNOSIS — M21611 Bunion of right foot: Secondary | ICD-10-CM | POA: Diagnosis not present

## 2022-11-06 NOTE — Patient Instructions (Signed)
Pre-Operative Instructions  Congratulations, you have decided to take an important step to improving your quality of life.  You can be assured that the doctors of [facility] will be with you every step of the way.  Plan to be at the surgery center/hospital at least 1 (one) hour prior to your scheduled time unless otherwise directed by the surgical center/hospital staff.  You must have a responsible adult accompany you, remain during the surgery and drive you home.  Make sure you have directions to the surgical center/hospital and know how to get there on time. For hospital based surgery you will need to obtain a history and physical form from your family physician within 1 month prior to the date of surgery- we will give you a form for you primary physician.  We make every effort to accommodate the date you request for surgery.  There are however, times where surgery dates or times have to be moved.  We will contact you as soon as possible if a change in schedule is required.   No Aspirin/Ibuprofen for one week before surgery.  If you are on aspirin, any non-steroidal anti-inflammatory medications (Mobic, Aleve, Ibuprofen) you should stop taking it 7 days prior to your surgery.  You make take Tylenol  For pain prior to surgery.  Medications- If you are taking daily heart and blood pressure medications, seizure, reflux, allergy, asthma, anxiety, pain or diabetes medications, make sure the surgery center/hospital is aware before the day of surgery so they may notify you which medications to take or avoid the day of surgery. No food or drink after midnight the night before surgery unless directed otherwise by surgical center/hospital staff. No alcoholic beverages 24 hours prior to surgery.  No smoking 24 hours prior to or 24 hours after surgery. Wear loose pants or shorts- loose enough to fit over bandages, boots, and casts. No slip on shoes, sneakers are best. Bring your boot with you to the  surgery center/hospital.  Also bring crutches or a walker if your physician has prescribed it for you.  If you do not have this equipment, it will be provided for you after surgery. If you have not been contracted by the surgery center/hospital by the day before your surgery, call to confirm the date and time of your surgery. Leave-time from work may vary depending on the type of surgery you have.  Appropriate arrangements should be made prior to surgery with your employer. Prescriptions will be provided immediately following surgery by your doctor.  Have these filled as soon as possible after surgery and take the medication as directed. Remove nail polish on the operative foot. Wash the night before surgery.  The night before surgery wash the foot and leg well with the antibacterial soap provided and water paying special attention to beneath the toenails and in between the toes.  Rinse thoroughly with water and dry well with a towel.  Perform this wash unless told not to do so by your physician.  Enclosed: 1 Ice pack (please put in freezer the night before surgery)   1 Hibiclens skin cleaner   Pre-op Instructions  If you have any questions regarding the instructions, do not hesitate to call our office at any point during this process.   [place]: [address] [place], [place] [address] [phone]  [place]: [address] [place], [place] [address] [phone]  Dr. Priscillia Fouch, DPM  

## 2022-11-06 NOTE — Progress Notes (Signed)
Spoke w/ via phone for pre-op interview---Rebekah Atkins needs dos----  CBC, T&S and HCG qual. Per Careers adviser.             Atkins results------ Current EKG in Epic dated 03/07/22. COVID test -----patient states asymptomatic no test needed Arrive at -------0850 NPO after MN NO Solid Food.  Clear liquids from MN until---0750 Med rec completed Medications to take morning of surgery -----NONE Diabetic medication ----- Patient instructed no nail polish to be worn day of surgery Patient instructed to bring photo id and insurance card day of surgery Patient aware to have Driver (ride ) / caregiver  Sister Rebekah Atkins  for 24 hours after surgery  Patient Special Instructions ----- Pre-Op special Instructions ----- Patient verbalized understanding of instructions that were given at this phone interview. Patient denies shortness of breath, chest pain, fever, cough at this phone interview.

## 2022-11-06 NOTE — Progress Notes (Signed)
Subjective: Chief Complaint  Patient presents with   Consult    Surgery consult, bilateral bunions   42 year old female presents the office today for surgical consultation for ongoing pain bunion of her left foot.  She states she has bunions on both feet that she was starting the left side.  I previously saw her in September of last year we discussed surgical intervention and she thought about she was to proceed.  She tried shoe modifications, offloading,  Significant improvement.  Objective: AAO x3, NAD DP/PT pulses palpable bilaterally, CRT less than 3 seconds Moderate bunion present left side worse however the right side worse than left clinically.  Tenderness palpation directly over the bunion areas.  There is no pain or crepitation with first image range of motion.  No hypermobility present the first ray on the left.  No pain with calf compression, swelling, warmth, erythema  Assessment: 42 year old female symptomatic bunion  Plan: -All treatment options discussed with the patient including all alternatives, risks, complications.  -Again reviewed the x-rays with her.  Discussed surgical versus conservative treatment options.  Ultimately we discussed stent, possible Akin osteotomy left side.  She was proceed with surgery in the left to start. -The incision placement as well as the postoperative course was discussed with the patient. I discussed risks of the surgery which include, but not limited to, infection, bleeding, pain, swelling, need for further surgery, delayed or nonhealing, painful or ugly scar, numbness or sensation changes, over/under correction, recurrence, transfer lesions, further deformity, hardware failure, DVT/PE, loss of toe/foot. Patient understands these risks and wishes to proceed with surgery. The surgical consent was reviewed with the patient all 3 pages were signed. No promises or guarantees were given to the outcome of the procedure. All questions were answered to  the best of my ability. Before the surgery the patient was encouraged to call the office if there is any further questions. The surgery will be performed at the [facility] on an outpatient basis. -Cam boot dispensed for postoperative use.(States that she previously returned the other) -Patient encouraged to call the office with any questions, concerns, change in symptoms.   No follow-ups on file.  Vivi Barrack DPM

## 2022-11-21 ENCOUNTER — Encounter (HOSPITAL_BASED_OUTPATIENT_CLINIC_OR_DEPARTMENT_OTHER)
Admission: RE | Disposition: A | Discharge status: Home / Self Care | Payer: Self-pay | Source: Home / Self Care | Attending: Obstetrics & Gynecology

## 2022-11-21 ENCOUNTER — Ambulatory Visit (HOSPITAL_BASED_OUTPATIENT_CLINIC_OR_DEPARTMENT_OTHER): Payer: Medicaid Other | Admitting: Anesthesiology

## 2022-11-21 ENCOUNTER — Other Ambulatory Visit: Payer: Self-pay

## 2022-11-21 ENCOUNTER — Ambulatory Visit (HOSPITAL_BASED_OUTPATIENT_CLINIC_OR_DEPARTMENT_OTHER)
Admission: RE | Admit: 2022-11-21 | Discharge: 2022-11-21 | Disposition: A | Discharge status: Home / Self Care | Payer: Medicaid Other | Attending: Obstetrics & Gynecology | Admitting: Obstetrics & Gynecology

## 2022-11-21 ENCOUNTER — Encounter (HOSPITAL_BASED_OUTPATIENT_CLINIC_OR_DEPARTMENT_OTHER): Payer: Self-pay | Admitting: Obstetrics & Gynecology

## 2022-11-21 DIAGNOSIS — E063 Autoimmune thyroiditis: Secondary | ICD-10-CM | POA: Diagnosis not present

## 2022-11-21 DIAGNOSIS — N841 Polyp of cervix uteri: Secondary | ICD-10-CM

## 2022-11-21 DIAGNOSIS — Z01818 Encounter for other preprocedural examination: Secondary | ICD-10-CM

## 2022-11-21 DIAGNOSIS — N920 Excessive and frequent menstruation with regular cycle: Secondary | ICD-10-CM | POA: Diagnosis present

## 2022-11-21 HISTORY — DX: Autoimmune thyroiditis: E06.3

## 2022-11-21 HISTORY — PX: DILITATION & CURRETTAGE/HYSTROSCOPY WITH NOVASURE ABLATION: SHX5568

## 2022-11-21 HISTORY — DX: Other specified postprocedural states: Z98.890

## 2022-11-21 HISTORY — DX: Other specified postprocedural states: R11.2

## 2022-11-21 LAB — CBC
HCT: 34.2 % — ABNORMAL LOW (ref 36.0–46.0)
Hemoglobin: 11.2 g/dL — ABNORMAL LOW (ref 12.0–15.0)
MCH: 30.6 pg (ref 26.0–34.0)
MCHC: 32.7 g/dL (ref 30.0–36.0)
MCV: 93.4 fL (ref 80.0–100.0)
Platelets: 258 10*3/uL (ref 150–400)
RBC: 3.66 MIL/uL — ABNORMAL LOW (ref 3.87–5.11)
RDW: 12.7 % (ref 11.5–15.5)
WBC: 4.1 10*3/uL (ref 4.0–10.5)
nRBC: 0 % (ref 0.0–0.2)

## 2022-11-21 LAB — TYPE AND SCREEN
ABO/RH(D): O NEG
Antibody Screen: NEGATIVE

## 2022-11-21 LAB — HCG, SERUM, QUALITATIVE: Preg, Serum: NEGATIVE

## 2022-11-21 LAB — NO BLOOD PRODUCTS

## 2022-11-21 SURGERY — DILATATION & CURETTAGE/HYSTEROSCOPY WITH NOVASURE ABLATION
Anesthesia: General | Site: Uterus

## 2022-11-21 MED ORDER — PROPOFOL 10 MG/ML IV BOLUS
INTRAVENOUS | Status: AC
  Filled 2022-11-21: qty 20

## 2022-11-21 MED ORDER — LACTATED RINGERS IV SOLN: INTRAVENOUS | Status: DC

## 2022-11-21 MED ORDER — ONDANSETRON HCL 4 MG/2ML IJ SOLN
INTRAMUSCULAR | Status: AC
  Filled 2022-11-21: qty 2

## 2022-11-21 MED ORDER — HYDROCODONE-ACETAMINOPHEN 5-325 MG PO TABS: 1.0000 | ORAL_TABLET | Freq: Four times a day (QID) | ORAL | 0 refills | Status: DC | PRN

## 2022-11-21 MED ORDER — GABAPENTIN 100 MG PO CAPS: 100.0000 mg | ORAL_CAPSULE | ORAL | Status: DC

## 2022-11-21 MED ORDER — LIDOCAINE HCL (PF) 2 % IJ SOLN
INTRAMUSCULAR | Status: AC
  Filled 2022-11-21: qty 5

## 2022-11-21 MED ORDER — FENTANYL CITRATE (PF) 100 MCG/2ML IJ SOLN
INTRAMUSCULAR | Status: AC
  Filled 2022-11-21: qty 2

## 2022-11-21 MED ORDER — MIDAZOLAM HCL 2 MG/2ML IJ SOLN
INTRAMUSCULAR | Status: AC
  Filled 2022-11-21: qty 2

## 2022-11-21 MED ORDER — EPHEDRINE SULFATE (PRESSORS) 50 MG/ML IJ SOLN
INTRAMUSCULAR | Status: DC | PRN
  Administered 2022-11-21: 10 mg via INTRAVENOUS

## 2022-11-21 MED ORDER — FENTANYL CITRATE (PF) 100 MCG/2ML IJ SOLN
INTRAMUSCULAR | Status: DC | PRN
  Administered 2022-11-21 (×2): 25 ug via INTRAVENOUS
  Administered 2022-11-21: 50 ug via INTRAVENOUS

## 2022-11-21 MED ORDER — DEXAMETHASONE SODIUM PHOSPHATE 10 MG/ML IJ SOLN
INTRAMUSCULAR | Status: AC
  Filled 2022-11-21: qty 1

## 2022-11-21 MED ORDER — OXYCODONE HCL 5 MG PO TABS: 5.0000 mg | ORAL_TABLET | Freq: Once | ORAL | Status: DC | PRN

## 2022-11-21 MED ORDER — LIDOCAINE-EPINEPHRINE 1 %-1:100000 IJ SOLN
INTRAMUSCULAR | Status: DC | PRN
  Administered 2022-11-21: 10 mL

## 2022-11-21 MED ORDER — IBUPROFEN 800 MG PO TABS: 800.0000 mg | ORAL_TABLET | Freq: Three times a day (TID) | ORAL | 0 refills | Status: DC | PRN

## 2022-11-21 MED ORDER — POVIDONE-IODINE 10 % EX SWAB: 2.0000 | Freq: Once | CUTANEOUS | Status: DC

## 2022-11-21 MED ORDER — SODIUM CHLORIDE 0.9 % IR SOLN
Status: DC | PRN
  Administered 2022-11-21: 1300 mL

## 2022-11-21 MED ORDER — ONDANSETRON HCL 4 MG/2ML IJ SOLN
INTRAMUSCULAR | Status: DC | PRN
  Administered 2022-11-21: 4 mg via INTRAVENOUS

## 2022-11-21 MED ORDER — GABAPENTIN 100 MG PO CAPS
ORAL_CAPSULE | ORAL | Status: AC
  Filled 2022-11-21: qty 1

## 2022-11-21 MED ORDER — MIDAZOLAM HCL 5 MG/5ML IJ SOLN
INTRAMUSCULAR | Status: DC | PRN
  Administered 2022-11-21: 2 mg via INTRAVENOUS

## 2022-11-21 MED ORDER — FENTANYL CITRATE (PF) 100 MCG/2ML IJ SOLN
25.0000 ug | INTRAMUSCULAR | Status: DC | PRN
  Administered 2022-11-21: 25 ug via INTRAVENOUS

## 2022-11-21 MED ORDER — KETOROLAC TROMETHAMINE 30 MG/ML IJ SOLN
INTRAMUSCULAR | Status: DC | PRN
  Administered 2022-11-21: 30 mg via INTRAVENOUS

## 2022-11-21 MED ORDER — ACETAMINOPHEN 500 MG PO TABS
1000.0000 mg | ORAL_TABLET | ORAL | Status: AC
  Administered 2022-11-21: 1000 mg via ORAL

## 2022-11-21 MED ORDER — PROPOFOL 10 MG/ML IV BOLUS
INTRAVENOUS | Status: DC | PRN
  Administered 2022-11-21: 200 mg via INTRAVENOUS

## 2022-11-21 MED ORDER — ACETAMINOPHEN 500 MG PO TABS
ORAL_TABLET | ORAL | Status: AC
  Filled 2022-11-21: qty 2

## 2022-11-21 MED ORDER — ONDANSETRON HCL 4 MG/2ML IJ SOLN: 4.0000 mg | Freq: Once | INTRAMUSCULAR | Status: DC | PRN

## 2022-11-21 MED ORDER — DEXAMETHASONE SODIUM PHOSPHATE 4 MG/ML IJ SOLN
INTRAMUSCULAR | Status: DC | PRN
  Administered 2022-11-21: 5 mg via INTRAVENOUS

## 2022-11-21 MED ORDER — LIDOCAINE 2% (20 MG/ML) 5 ML SYRINGE
INTRAMUSCULAR | Status: DC | PRN
  Administered 2022-11-21: 100 mg via INTRAVENOUS

## 2022-11-21 MED ORDER — OXYCODONE HCL 5 MG/5ML PO SOLN: 5.0000 mg | Freq: Once | ORAL | Status: DC | PRN

## 2022-11-21 SURGICAL SUPPLY — 22 items
ABLATOR SURESOUND NOVASURE (ABLATOR) IMPLANT
CATH ROBINSON RED A/P 16FR (CATHETERS) ×1 IMPLANT
CNTNR URN SCR LID CUP LEK RST (MISCELLANEOUS) ×2 IMPLANT
CONT SPEC 4OZ STRL OR WHT (MISCELLANEOUS)
DEVICE MYOSURE LITE (MISCELLANEOUS) IMPLANT
DILATOR CANAL MILEX (MISCELLANEOUS) IMPLANT
DRSG TELFA 3X8 NADH STRL (GAUZE/BANDAGES/DRESSINGS) ×1 IMPLANT
ELECT REM PT RETURN 9FT ADLT (ELECTROSURGICAL)
ELECTRODE REM PT RTRN 9FT ADLT (ELECTROSURGICAL) IMPLANT
GAUZE 4X4 16PLY ~~LOC~~+RFID DBL (SPONGE) ×2 IMPLANT
GLOVE BIOGEL PI IND STRL 7.0 (GLOVE) ×2 IMPLANT
GLOVE ECLIPSE 6.5 STRL STRAW (GLOVE) ×1 IMPLANT
GOWN STRL REUS W/TWL LRG LVL3 (GOWN DISPOSABLE) ×2 IMPLANT
IV NS IRRIG 3000ML ARTHROMATIC (IV SOLUTION) ×1 IMPLANT
KIT PROCEDURE FLUENT (KITS) ×1 IMPLANT
KIT TURNOVER CYSTO (KITS) ×1 IMPLANT
PACK VAGINAL MINOR WOMEN LF (CUSTOM PROCEDURE TRAY) ×1 IMPLANT
PAD OB MATERNITY 4.3X12.25 (PERSONAL CARE ITEMS) ×1 IMPLANT
SEAL ROD LENS SCOPE MYOSURE (ABLATOR) ×1 IMPLANT
SLEEVE SCD COMPRESS KNEE MED (STOCKING) ×1 IMPLANT
SOL PREP POV-IOD 4OZ 10% (MISCELLANEOUS) IMPLANT
TOWEL OR 17X24 6PK STRL BLUE (TOWEL DISPOSABLE) ×2 IMPLANT

## 2022-11-21 NOTE — H&P (Signed)
Rebekah Atkins is an 42 y.o. female G1P1 MWF with history of menorrhagia here for hysterscopy, D&C and endometrial ablation.  She has undergone evaluation with ultrasound with uterus measuring 8 x 5.3 x 4.0cm.  Endometrium 8.82mm.  Ovaries normal.  Endometrial sampling will be done today.  Pt does understand if this is abnormal, will need additional treatment/therapy.  She has been using TXA to help with bleeding as well.  Pt also understands pregnancy not recommended after this procedure.  BTL recommended pt spouse has decided to proceed with vasectomy.  Procedure discussed with patient.    Risks have been reviewed with pt including but not limited to bleeding, rare risk of transfusion, infection, 1% risk of uterine perforation with risks of fluid deficit causing cardiac arrythmia, cerebral swelling and/or need to stop procedure early.  Fluid emboli and rare risk of death discussed.  DVT/PE, rare risk of risk of bowel/bladder/ureteral/vascular injury.   Pertinent Gynecological History: Menses:  regular Bleeding: heavy Contraception: condoms DES exposure: denies Blood transfusions: none Sexually transmitted diseases: no past history Previous GYN Procedures:  vaginal delivery   Last mammogram: normal Date: 7/102/2023 Last pap: normal Date: 04/18/2022 OB History: G1, P1   Menstrual History: Patient's last menstrual period was 11/18/2022.    Past Medical History:  Diagnosis Date   Hashimoto's disease    PONV (postoperative nausea and vomiting)     Past Surgical History:  Procedure Laterality Date   L1 Back surgery      History reviewed. No pertinent family history.  Social History:  reports that she has never smoked. She does not have any smokeless tobacco history on file. She reports current alcohol use. She reports that she does not use drugs.  Allergies:  Allergies  Allergen Reactions   Augmentin [Amoxicillin-Pot Clavulanate]     "Racing heart rate"   Tetracycline Hives    Whole Blood     PATIENT DOES NOT ACCEPT BLOOD OR BLOOD PRODUCTS.   Doxycycline Rash    All cycline medicationsSOB, hives and bruising    Medications Prior to Admission  Medication Sig Dispense Refill Last Dose   acetaminophen (TYLENOL) 500 MG tablet Take 1,000 mg by mouth every 8 (eight) hours as needed for moderate pain or fever.   Unknown   ibuprofen (ADVIL) 600 MG tablet Take 600 mg by mouth every 6 (six) hours.   Unknown   tranexamic acid (LYSTEDA) 650 MG TABS tablet Take 2 tablets (1,300 mg total) by mouth 3 (three) times daily. Do not take for more than 5 days. 30 tablet 1 Unknown    Review of Systems  Constitutional: Negative.   Genitourinary:        Heavy menstrual bleeding    Blood pressure 117/65, pulse 71, temperature 98 F (36.7 C), temperature source Oral, resp. rate 17, height 5\' 7"  (1.702 m), weight 69.4 kg, last menstrual period 11/18/2022, SpO2 98 %. Physical Exam Constitutional:      Appearance: Normal appearance.  Cardiovascular:     Rate and Rhythm: Normal rate and regular rhythm.  Pulmonary:     Effort: Pulmonary effort is normal.  Neurological:     General: No focal deficit present.     Mental Status: She is alert.  Psychiatric:        Mood and Affect: Mood normal.     Results for orders placed or performed during the hospital encounter of 11/21/22 (from the past 24 hour(s))  CBC     Status: Abnormal   Collection Time: 11/21/22  9:10 AM  Result Value Ref Range   WBC 4.1 4.0 - 10.5 K/uL   RBC 3.66 (L) 3.87 - 5.11 MIL/uL   Hemoglobin 11.2 (L) 12.0 - 15.0 g/dL   HCT 40.9 (L) 81.1 - 91.4 %   MCV 93.4 80.0 - 100.0 fL   MCH 30.6 26.0 - 34.0 pg   MCHC 32.7 30.0 - 36.0 g/dL   RDW 78.2 95.6 - 21.3 %   Platelets 258 150 - 400 K/uL   nRBC 0.0 0.0 - 0.2 %  Type and screen     Status: None   Collection Time: 11/21/22  9:10 AM  Result Value Ref Range   ABO/RH(D) O NEG    Antibody Screen NEG    Sample Expiration      11/24/2022,2359 Performed at [facility], [address], [place], [place] [address]   hCG, serum, qualitative     Status: None   Collection Time: 11/21/22  9:10 AM  Result Value Ref Range   Preg, Serum NEGATIVE NEGATIVE    No results found.  Assessment/Plan: 69 you G1P1 MWF with h/o menorrhagia and normal evaluation here for hysteroscopy, D&C, and endometrial ablation.  Procedure reviewed.  Risks/benefits discussed.  Pt ready to proceed.  Rebekah Atkins 11/21/2022, 11:21 AM

## 2022-11-21 NOTE — Anesthesia Procedure Notes (Signed)
Procedure Name: LMA Insertion Date/Time: 11/21/2022 11:36 AM  Performed by: Jessica Priest, CRNAPre-anesthesia Checklist: Patient identified, Emergency Drugs available, Suction available, Patient being monitored and Timeout performed Patient Re-evaluated:Patient Re-evaluated prior to induction Oxygen Delivery Method: Circle system utilized Preoxygenation: Pre-oxygenation with 100% oxygen Induction Type: IV induction Ventilation: Mask ventilation without difficulty LMA: LMA inserted LMA Size: 4.0 Number of attempts: 1 Airway Equipment and Method: Bite block Placement Confirmation: positive ETCO2, breath sounds checked- equal and bilateral and CO2 detector Tube secured with: Tape Dental Injury: Teeth and Oropharynx as per pre-operative assessment

## 2022-11-21 NOTE — Anesthesia Preprocedure Evaluation (Signed)
Anesthesia Evaluation  Patient identified by MRN, date of birth, ID band Patient awake    Reviewed: Allergy & Precautions, H&P , NPO status , Patient's Chart, lab work & pertinent test results  Airway Mallampati: I  TM Distance: >3 FB Neck ROM: Full    Dental no notable dental hx.    Pulmonary neg pulmonary ROS   Pulmonary exam normal breath sounds clear to auscultation       Cardiovascular negative cardio ROS Normal cardiovascular exam Rhythm:Regular Rate:Normal     Neuro/Psych negative neurological ROS  negative psych ROS   GI/Hepatic negative GI ROS, Neg liver ROS,,,  Endo/Other  negative endocrine ROS    Renal/GU negative Renal ROS  negative genitourinary   Musculoskeletal negative musculoskeletal ROS (+)    Abdominal   Peds negative pediatric ROS (+)  Hematology negative hematology ROS (+)   Anesthesia Other Findings   Reproductive/Obstetrics negative OB ROS                             Anesthesia Physical Anesthesia Plan  ASA: 1  Anesthesia Plan: General   Post-op Pain Management: Minimal or no pain anticipated and Toradol IV (intra-op)*   Induction: Intravenous  PONV Risk Score and Plan: 3 and Ondansetron, Dexamethasone, Midazolam and Treatment may vary due to age or medical condition  Airway Management Planned: LMA  Additional Equipment:   Intra-op Plan:   Post-operative Plan: Extubation in OR  Informed Consent: I have reviewed the patients History and Physical, chart, labs and discussed the procedure including the risks, benefits and alternatives for the proposed anesthesia with the patient or authorized representative who has indicated his/her understanding and acceptance.     Dental advisory given  Plan Discussed with: CRNA and Surgeon  Anesthesia Plan Comments:        Anesthesia Quick Evaluation

## 2022-11-21 NOTE — Op Note (Signed)
11/21/2022  12:14 PM  PATIENT:  Rebekah Atkins  42 y.o. female  PRE-OPERATIVE DIAGNOSIS:  Menorrhagia  POST-OPERATIVE DIAGNOSIS:  Menorrhagia, endocervical polyps  PROCEDURE:  Procedure(s): DILATATION & CURETTAGE/HYSTEROSCOPY WITH NOVASURE ABLATION, Removal of endocervical polyps  SURGEON:  Jerene Bears  ASSISTANTS: OR staff.    ANESTHESIA:   general  ESTIMATED BLOOD LOSS: 10 mL  BLOOD ADMINISTERED:none   FLUIDS: 700 cc LR  UOP: 50cc   SPECIMEN:  endometrial curettings and endocervical polyps  DISPOSITION OF SPECIMEN:  PATHOLOGY  FINDINGS: Two endocervical polyps that were also removed  DESCRIPTION OF OPERATION: Patient was taken to the operating room.  She is placed in the supine position. SCDs were on her lower extremities and functioning properly. General anesthesia with an LMA was administered without difficulty. Dr. Durwin Nora, anesthesia, oversaw case.  Legs were then placed in the [facility] stirrups in the low lithotomy position. The legs were lifted to the high lithotomy position and the Betadine prep was used on the inner thighs perineum and vagina x3. Patient was draped in a normal standard fashion. An in and out catheterization with a red rubber Foley catheter was performed. Approximately 50 cc of clear urine was noted. A bivalve speculum was placed the vagina. The anterior lip of the cervix was grasped with single-tooth tenaculum.  A paracervical block of 1% lidocaine mixed one-to-one with epinephrine (1:100,000 units).  10 cc was used total. The cervix is dilated up to #19 [facility] dilators. The endometrial cavity sounded to 8 cm.   A Myosure hysteroscope was obtained. Normal saline was used as a hysteroscopic fluid. The hysteroscope was advanced through the endocervical canal into the endometrial cavity. The tubal ostia were noted bilaterally. Additional findings included two endocervical polyps.  The hysteroscope was removed. A #1 toothed curette was used to curette the  cavity until rough gritty texture was noted in all quadrants. Then the Novasure device was obtained.  The cavity length was 4.0cm.  the device was passed to the fundus and then the array opened.  The device was seated.  The cavity width was 3.0.  Device settings for the procedure were 66W.  The cavity assessment test was performed and passed without difficulty.  Then the ablation cycle was initiated.  This lasted 1:39 minutes.  Device was removed.  The endometrial cavity was revisualized with the hysteroscopy.  Good ablation was noted with char throughout the endometrial cavity.    Then the device was withdrawn into the cervcial canal and using the Myosure lite device the endocervical polyps were resected.  Photo documentation was made of the endometrial cavity before and after the procedure and of the cervical canal after the polyps were removed.    At this point no other procedure was needed and this procedure was ended. The hysteroscope was removed. The fluid deficit was 110 cc. The tenaculum was removed from the anterior lip of the cervix. The speculum was removed from the vagina. The prep was cleansed of the patient's skin. The legs are positioned back in the supine position. Sponge, lap, needle, instrument counts were correct x2. Patient was taken to recovery in stable condition.   COUNTS:  YES  PLAN OF CARE: Transfer to PACU

## 2022-11-21 NOTE — Transfer of Care (Signed)
Immediate Anesthesia Transfer of Care Note  Patient: Rebekah Atkins  Procedure(s) Performed: Procedure(s) (LRB): DILATATION & CURETTAGE/HYSTEROSCOPY WITH NOVASURE ABLATION (N/A)  Patient Location: PACU  Anesthesia Type: General  Level of Consciousness: awake, sedated, patient cooperative and responds to stimulation  Airway & Oxygen Therapy: Patient Spontanous Breathing and Patient connected to [place] oxygen  Post-op Assessment: Report given to PACU RN, Post -op Vital signs reviewed and stable and Patient moving all extremities  Post vital signs: Reviewed and stable  Complications: No apparent anesthesia complications

## 2022-11-21 NOTE — Discharge Instructions (Addendum)
Post-surgical Instructions, Outpatient Surgery  You may expect to feel dizzy, weak, and drowsy for as long as 24 hours after receiving the medicine that made you sleep (anesthetic). For the first 24 hours after your surgery:   Do not drive a car, ride a bicycle, participate in physical activities, or take public transportation until you are done taking narcotic pain medicines or as directed by Dr. Hyacinth Meeker.  Do not drink alcohol or take tranquilizers.  Do not take medicine that has not been prescribed by your physicians.  Do not sign important papers or make important decisions while on narcotic pain medicines.  Have a responsible person with you.   PAIN MANAGEMENT Motrin 800mg .  (This is the same as 4-200mg  over the counter tablets of Motrin or ibuprofen.)  You may take this every eight hours or as needed for cramping.   Vicodin 5/325mg .  For more severe pain, take one or two tablets every four to six hours as needed for pain control.  (Remember that narcotic pain medications increase your risk of constipation.  If this becomes a problem, you may take an over the counter stool softener like Colace 100mg  up to four times a day.)  if you take the narcotic pain medication, please do not take extra Tylenol as this contains tylenol.  DO'S AND DON'T'S Do not take a tub bath for one week.  You may shower on the first day after your surgery Do not do any heavy lifting for one to two weeks.  This increases the chance of bleeding. Do move around as you feel able.  Stairs are fine.  You may begin to exercise again as you feel able.  Do not lift any weights for two weeks. Do not put anything in the vagina for two weeks--no tampons, intercourse, or douching.    REGULAR MEDIATIONS/VITAMINS: You may restart all of your regular medications as prescribed. You may restart all of your vitamins as you normally take them.    PLEASE CALL OR SEEK MEDICAL CARE IF: You have persistent nausea and vomiting.  You have  trouble eating or drinking.  You have an oral temperature above 100.5.  You have constipation that is not helped by adjusting diet or increasing fluid intake. Pain medicines are a common cause of constipation.  You have heavy vaginal bleeding    D & C Home care Instructions:   Personal hygiene:  Used sanitary napkins for vaginal drainage not tampons. Shower or tub bathe the day after your procedure. No douching until bleeding stops. Always wipe from front to back after  Elimination.  Activity: Do not drive or operate any equipment today. The effects of the anesthesia are still present and drowsiness may result. Rest today, not necessarily flat bed rest, just take it easy. You may resume your normal activity in one to 2 days.  Sexual activity: No intercourse for one week or as indicated by your physician  Diet: Eat a light diet as desired this evening. You may resume a regular diet tomorrow.  Return to work: One to 2 days.  General Expectations of your surgery: Vaginal bleeding should be no heavier than a normal period. Spotting may continue up to 10 days. Mild cramps may continue for a couple of days. You may have a regular period in 2-6 weeks.  Unexpected observations call your doctor if these occur: persistent or heavy bleeding. Severe abdominal cramping or pain. Elevation of temperature greater than 100F.   Post Anesthesia Home Care Instructions  Activity:  Get plenty of rest for the remainder of the day. A responsible individual must stay with you for 24 hours following the procedure.  For the next 24 hours, DO NOT: -Drive a car -Advertising copywriter -Drink alcoholic beverages -Take any medication unless instructed by your physician -Make any legal decisions or sign important papers.  Meals: Start with liquid foods such as gelatin or soup. Progress to regular foods as tolerated. Avoid greasy, spicy, heavy foods. If nausea and/or vomiting occur, drink only clear liquids until the  nausea and/or vomiting subsides. Call your physician if vomiting continues.  Special Instructions/Symptoms: Your throat may feel dry or sore from the anesthesia or the breathing tube placed in your throat during surgery. If this causes discomfort, gargle with warm salt water. The discomfort should disappear within 24 hours.  No acetaminophen/Tylenol until after 3:00 pm today if needed. No ibuprofen, Advil, Aleve, Motrin, ketorolac, meloxicam, naproxen, or other NSAIDS until after 6:00 pm today if needed.

## 2022-11-21 NOTE — Anesthesia Postprocedure Evaluation (Signed)
Anesthesia Post Note  Patient: Graciana Nothdurft Hughley  Procedure(s) Performed: DILATATION & CURETTAGE/HYSTEROSCOPY WITH NOVASURE ABLATION (Uterus)     Patient location during evaluation: PACU Anesthesia Type: General Level of consciousness: awake and alert Pain management: pain level controlled Vital Signs Assessment: post-procedure vital signs reviewed and stable Respiratory status: spontaneous breathing, nonlabored ventilation, respiratory function stable and patient connected to nasal cannula oxygen Cardiovascular status: blood pressure returned to baseline and stable Postop Assessment: no apparent nausea or vomiting Anesthetic complications: no  No notable events documented.  Last Vitals:  Vitals:   11/21/22 0930 11/21/22 1215  BP: 117/65 118/61  Pulse: 71 (!) 102  Resp: 17   Temp: 36.7 C 36.6 C  SpO2: 98% 100%    Last Pain:  Vitals:   11/21/22 1221  TempSrc:   PainSc: 3                  Maclovia Uher S

## 2022-11-22 LAB — SURGICAL PATHOLOGY

## 2022-11-23 ENCOUNTER — Telehealth: Payer: Self-pay

## 2022-11-23 ENCOUNTER — Encounter (HOSPITAL_BASED_OUTPATIENT_CLINIC_OR_DEPARTMENT_OTHER): Payer: Self-pay | Admitting: Obstetrics & Gynecology

## 2022-11-23 NOTE — Telephone Encounter (Signed)
Rebekah Atkins called to reschedule her surgery with Dr. Ardelle Anton on 12/27/2022. She stated she needs to wait till later this year. I have her rescheduled to 04/04/2023.

## 2022-12-18 ENCOUNTER — Other Ambulatory Visit: Payer: Self-pay | Admitting: Family Medicine

## 2022-12-18 DIAGNOSIS — Z0000 Encounter for general adult medical examination without abnormal findings: Secondary | ICD-10-CM

## 2023-01-02 ENCOUNTER — Encounter: Payer: Medicaid Other | Admitting: Podiatry

## 2023-01-11 ENCOUNTER — Encounter: Payer: Medicaid Other | Admitting: Podiatry

## 2023-01-11 ENCOUNTER — Ambulatory Visit
Admission: RE | Admit: 2023-01-11 | Discharge: 2023-01-11 | Disposition: A | Discharge status: Home / Self Care | Payer: Medicaid Other | Source: Ambulatory Visit | Attending: Family Medicine | Admitting: Family Medicine

## 2023-01-11 DIAGNOSIS — Z0000 Encounter for general adult medical examination without abnormal findings: Secondary | ICD-10-CM

## 2023-01-25 ENCOUNTER — Encounter: Payer: Medicaid Other | Admitting: Podiatry

## 2023-02-06 ENCOUNTER — Telehealth: Payer: Self-pay | Admitting: Podiatry

## 2023-02-06 NOTE — Telephone Encounter (Signed)
Received the below message. I have attempted to call patient, no answer. Left VM to call back.  "Good morning Dr Ardelle Anton.. This patient has questions regarding how long she will be in the boot after surgery and if she'll be weight bearing or not. She also is inquiring about a nerve block for surgery as well."

## 2023-02-19 ENCOUNTER — Ambulatory Visit (HOSPITAL_BASED_OUTPATIENT_CLINIC_OR_DEPARTMENT_OTHER): Payer: Self-pay | Admitting: Obstetrics & Gynecology

## 2023-02-19 ENCOUNTER — Telehealth (HOSPITAL_BASED_OUTPATIENT_CLINIC_OR_DEPARTMENT_OTHER): Payer: Self-pay | Admitting: Obstetrics & Gynecology

## 2023-02-19 NOTE — Telephone Encounter (Signed)
Called patient about today's appointment .Patient  stated she forgot and no longer needs this follow up appointment .

## 2023-03-28 ENCOUNTER — Encounter: Payer: Self-pay | Admitting: Podiatry

## 2023-03-28 NOTE — Progress Notes (Signed)
Called pt to complete pre-op interview for surgery on 04-04-2023 by Dr Ardelle Anton @[facility] .  Pt stated her surgery was rescheduled to 04-18-2023.  Stated she had noted in my chart the her surgery was still on 04-04-2023 and called the office and was told not to worry that would get changed.  However, patient is not rescheduled @[facility]  on 04-18-2023 or anywhere in [facility] system.  Called and left message for OR scheduler for Dr Ardelle Anton, informed her of what pt has told us, asked for return call and or call centralized scheduling and have case moved to 04-18-2023.

## 2023-03-29 ENCOUNTER — Telehealth: Payer: Self-pay | Admitting: Podiatry

## 2023-03-29 NOTE — Telephone Encounter (Signed)
DOS- 04/18/2023  DOUBLE OSTEOTOMY [id number]  MEDICAID [place] [place] EFFECTIVE DATE- 08/03/2022  PER INCOMING FAX FROM [employer], PRIOR AUTHORIZATION HAS BEEN APPROVED FOR CPT CODE 81191. GOOD FROM 04/18/2023 - 06/16/2023.  [employer] PA # : [id number]

## 2023-04-06 ENCOUNTER — Encounter (HOSPITAL_BASED_OUTPATIENT_CLINIC_OR_DEPARTMENT_OTHER): Payer: Self-pay | Admitting: Podiatry

## 2023-04-09 ENCOUNTER — Encounter: Payer: Medicaid Other | Admitting: Podiatry

## 2023-04-09 ENCOUNTER — Encounter (HOSPITAL_BASED_OUTPATIENT_CLINIC_OR_DEPARTMENT_OTHER): Payer: Self-pay | Admitting: Podiatry

## 2023-04-09 NOTE — Progress Notes (Addendum)
Addendum:  on 04-17-2023  Received via fax pt's pcp H&P dated 04-17-2023 from Dr Ardelle Anton office, placed in chart.  Addendum:  on 04-16-2023  called and left message OR scheduler for Dr Ardelle Anton, inquiring about status of patient's H&P, call back and fax number given.  Spoke w/ via phone for pre-op interview--- pt Lab needs dos---- urine preg        Lab results------ no COVID test -----patient states asymptomatic no test needed Arrive at ------- 1145 on 04-18-2023 NPO after MN NO Solid Food.  Clear liquids from MN until--- 1045 Med rec completed Medications to take morning of surgery ----- none Diabetic medication ----- n/a Patient instructed no nail polish to be worn day of surgery Patient instructed to bring photo id and insurance card day of surgery Patient aware to have Driver (ride ) -- pt stated understanding to have name/ number of driver at [employer]  / caregiver    for 24 hours after surgery - husband, patrick Patient Special Instructions ----- n/a Pre-Op special Instructions ----- sent inbox message to dr Ardelle Anton in epic, requested orders Have not received pt's pcp H&P yet from Dr Ardelle Anton office.  Patient verbalized understanding of instructions that were given at this phone interview. Patient denies chest pain, sob, fever, cough at the interview.

## 2023-04-16 NOTE — Telephone Encounter (Signed)
Attempted to call patient back, no answer. Left VM to call back.

## 2023-04-17 ENCOUNTER — Other Ambulatory Visit: Payer: Self-pay | Admitting: Podiatry

## 2023-04-17 MED ORDER — IBUPROFEN 800 MG PO TABS: 800.0000 mg | ORAL_TABLET | Freq: Three times a day (TID) | ORAL | 0 refills | Status: DC | PRN

## 2023-04-17 MED ORDER — OXYCODONE-ACETAMINOPHEN 5-325 MG PO TABS
1.0000 | ORAL_TABLET | Freq: Four times a day (QID) | ORAL | 0 refills | Status: DC | PRN
Start: 2023-04-17 — End: 2023-07-06

## 2023-04-17 MED ORDER — PROMETHAZINE HCL 25 MG PO TABS: 25.0000 mg | ORAL_TABLET | Freq: Three times a day (TID) | ORAL | 0 refills | Status: DC | PRN

## 2023-04-17 MED ORDER — CLINDAMYCIN HCL 300 MG PO CAPS: 300.0000 mg | ORAL_CAPSULE | Freq: Three times a day (TID) | ORAL | 0 refills | Status: DC

## 2023-04-17 NOTE — H&P (View-Only) (Signed)
Postop medications sent 

## 2023-04-17 NOTE — Progress Notes (Signed)
Postop medications sent 

## 2023-04-17 NOTE — Telephone Encounter (Signed)
I called patient back and answered all of her questions regarding postop/recovery. Sent in postop medication. No further questions

## 2023-04-18 ENCOUNTER — Encounter (HOSPITAL_BASED_OUTPATIENT_CLINIC_OR_DEPARTMENT_OTHER): Payer: Self-pay | Admitting: Podiatry

## 2023-04-18 ENCOUNTER — Other Ambulatory Visit: Payer: Self-pay

## 2023-04-18 ENCOUNTER — Encounter (HOSPITAL_BASED_OUTPATIENT_CLINIC_OR_DEPARTMENT_OTHER)
Admission: RE | Disposition: A | Discharge status: Home / Self Care | Payer: Self-pay | Source: Home / Self Care | Attending: Podiatry

## 2023-04-18 ENCOUNTER — Ambulatory Visit (HOSPITAL_BASED_OUTPATIENT_CLINIC_OR_DEPARTMENT_OTHER): Payer: Medicaid Other | Admitting: Anesthesiology

## 2023-04-18 ENCOUNTER — Ambulatory Visit (HOSPITAL_BASED_OUTPATIENT_CLINIC_OR_DEPARTMENT_OTHER): Payer: Medicaid Other

## 2023-04-18 ENCOUNTER — Ambulatory Visit (HOSPITAL_BASED_OUTPATIENT_CLINIC_OR_DEPARTMENT_OTHER)
Admission: RE | Admit: 2023-04-18 | Discharge: 2023-04-18 | Disposition: A | Discharge status: Home / Self Care | Payer: Medicaid Other | Attending: Podiatry | Admitting: Podiatry

## 2023-04-18 DIAGNOSIS — M21612 Bunion of left foot: Secondary | ICD-10-CM | POA: Diagnosis present

## 2023-04-18 DIAGNOSIS — Z01818 Encounter for other preprocedural examination: Secondary | ICD-10-CM

## 2023-04-18 HISTORY — PX: BUNIONECTOMY: SHX129

## 2023-04-18 HISTORY — DX: Autoimmune thyroiditis: E06.3

## 2023-04-18 HISTORY — DX: Procedure and treatment not carried out because of patient's decision for reasons of belief and group pressure: Z53.1

## 2023-04-18 HISTORY — DX: Presence of spectacles and contact lenses: Z97.3

## 2023-04-18 HISTORY — DX: Iron deficiency anemia, unspecified: D50.9

## 2023-04-18 HISTORY — DX: Asymptomatic varicose veins of unspecified lower extremity: I83.90

## 2023-04-18 LAB — POCT PREGNANCY, URINE: Preg Test, Ur: NEGATIVE

## 2023-04-18 SURGERY — BUNIONECTOMY
Anesthesia: Regional | Site: Toe | Laterality: Left

## 2023-04-18 MED ORDER — ACETAMINOPHEN 500 MG PO TABS
1000.0000 mg | ORAL_TABLET | Freq: Once | ORAL | Status: AC
  Administered 2023-04-18: 1000 mg via ORAL

## 2023-04-18 MED ORDER — 0.9 % SODIUM CHLORIDE (POUR BTL) OPTIME
TOPICAL | Status: DC | PRN
Start: 2023-04-18 — End: 2023-04-18
  Administered 2023-04-18: 500 mL

## 2023-04-18 MED ORDER — MIDAZOLAM HCL 2 MG/2ML IJ SOLN
INTRAMUSCULAR | Status: AC
  Filled 2023-04-18: qty 2

## 2023-04-18 MED ORDER — CLINDAMYCIN PHOSPHATE 900 MG/50ML IV SOLN
INTRAVENOUS | Status: AC
  Filled 2023-04-18: qty 50

## 2023-04-18 MED ORDER — GLYCOPYRROLATE PF 0.2 MG/ML IJ SOSY
PREFILLED_SYRINGE | INTRAMUSCULAR | Status: DC | PRN
Start: 2023-04-18 — End: 2023-04-18
  Administered 2023-04-18 (×2): .2 mg via INTRAVENOUS

## 2023-04-18 MED ORDER — ACETAMINOPHEN 500 MG PO TABS
ORAL_TABLET | ORAL | Status: AC
  Filled 2023-04-18: qty 2

## 2023-04-18 MED ORDER — EPHEDRINE 5 MG/ML INJ
INTRAVENOUS | Status: AC
  Filled 2023-04-18: qty 5

## 2023-04-18 MED ORDER — PROPOFOL 10 MG/ML IV BOLUS
INTRAVENOUS | Status: AC
  Filled 2023-04-18: qty 20

## 2023-04-18 MED ORDER — ONDANSETRON HCL 4 MG/2ML IJ SOLN
INTRAMUSCULAR | Status: DC | PRN
  Administered 2023-04-18: 4 mg via INTRAVENOUS

## 2023-04-18 MED ORDER — FENTANYL CITRATE (PF) 100 MCG/2ML IJ SOLN
100.0000 ug | Freq: Once | INTRAMUSCULAR | Status: AC
  Administered 2023-04-18: 50 ug via INTRAVENOUS

## 2023-04-18 MED ORDER — ROPIVACAINE HCL 5 MG/ML IJ SOLN
INTRAMUSCULAR | Status: DC | PRN
Start: 2023-04-18 — End: 2023-04-18
  Administered 2023-04-18: 30 mL via PERINEURAL

## 2023-04-18 MED ORDER — CLINDAMYCIN PHOSPHATE 900 MG/50ML IV SOLN
900.0000 mg | INTRAVENOUS | Status: AC
  Administered 2023-04-18: 900 mg via INTRAVENOUS

## 2023-04-18 MED ORDER — FENTANYL CITRATE (PF) 100 MCG/2ML IJ SOLN
INTRAMUSCULAR | Status: AC
  Filled 2023-04-18: qty 2

## 2023-04-18 MED ORDER — DEXAMETHASONE SODIUM PHOSPHATE 10 MG/ML IJ SOLN
INTRAMUSCULAR | Status: DC | PRN
Start: 2023-04-18 — End: 2023-04-18
  Administered 2023-04-18 (×2): 10 mg

## 2023-04-18 MED ORDER — MIDAZOLAM HCL 2 MG/2ML IJ SOLN
2.0000 mg | Freq: Once | INTRAMUSCULAR | Status: AC
  Administered 2023-04-18: 2 mg via INTRAVENOUS

## 2023-04-18 MED ORDER — LACTATED RINGERS IV SOLN: INTRAVENOUS | Status: DC

## 2023-04-18 MED ORDER — PHENYLEPHRINE 80 MCG/ML (10ML) SYRINGE FOR IV PUSH (FOR BLOOD PRESSURE SUPPORT)
PREFILLED_SYRINGE | INTRAVENOUS | Status: AC
  Filled 2023-04-18: qty 10

## 2023-04-18 MED ORDER — FENTANYL CITRATE (PF) 100 MCG/2ML IJ SOLN
25.0000 ug | INTRAMUSCULAR | Status: DC | PRN
  Administered 2023-04-18: 25 ug via INTRAVENOUS

## 2023-04-18 MED ORDER — CHLORHEXIDINE GLUCONATE CLOTH 2 % EX PADS: 6.0000 | MEDICATED_PAD | Freq: Once | CUTANEOUS | Status: DC

## 2023-04-18 MED ORDER — FENTANYL CITRATE (PF) 100 MCG/2ML IJ SOLN
INTRAMUSCULAR | Status: DC | PRN
  Administered 2023-04-18: 50 ug via INTRAVENOUS

## 2023-04-18 MED ORDER — EPHEDRINE SULFATE-NACL 50-0.9 MG/10ML-% IV SOSY
PREFILLED_SYRINGE | INTRAVENOUS | Status: DC | PRN
Start: 2023-04-18 — End: 2023-04-18
  Administered 2023-04-18 (×2): 10 mg via INTRAVENOUS
  Administered 2023-04-18: 5 mg via INTRAVENOUS

## 2023-04-18 MED ORDER — LIDOCAINE 2% (20 MG/ML) 5 ML SYRINGE
INTRAMUSCULAR | Status: DC | PRN
  Administered 2023-04-18: 20 mg via INTRAVENOUS

## 2023-04-18 MED ORDER — ONDANSETRON HCL 4 MG/2ML IJ SOLN
INTRAMUSCULAR | Status: AC
  Filled 2023-04-18: qty 2

## 2023-04-18 MED ORDER — DEXAMETHASONE SODIUM PHOSPHATE 10 MG/ML IJ SOLN
INTRAMUSCULAR | Status: AC
  Filled 2023-04-18: qty 1

## 2023-04-18 MED ORDER — PROPOFOL 10 MG/ML IV BOLUS
INTRAVENOUS | Status: DC | PRN
  Administered 2023-04-18: 200 mg via INTRAVENOUS

## 2023-04-18 MED ORDER — PHENYLEPHRINE 80 MCG/ML (10ML) SYRINGE FOR IV PUSH (FOR BLOOD PRESSURE SUPPORT)
PREFILLED_SYRINGE | INTRAVENOUS | Status: DC | PRN
Start: 2023-04-18 — End: 2023-04-18
  Administered 2023-04-18 (×4): 160 ug via INTRAVENOUS

## 2023-04-18 MED ORDER — SODIUM CHLORIDE 0.9 % IV SOLN: INTRAVENOUS | Status: DC

## 2023-04-18 SURGICAL SUPPLY — 64 items
APL PRP STRL LF DISP 70% ISPRP (MISCELLANEOUS) ×1
BLADE AVERAGE 25X9 (BLADE) IMPLANT
BLADE SURG 15 STRL LF DISP TIS (BLADE) ×1 IMPLANT
BLADE SURG 15 STRL SS (BLADE) ×1
BNDG CMPR 5X3 CHSV STRCH STRL (GAUZE/BANDAGES/DRESSINGS) ×1
BNDG CMPR 5X3 KNIT ELC UNQ LF (GAUZE/BANDAGES/DRESSINGS) ×1
BNDG CMPR 5X4 KNIT ELC UNQ LF (GAUZE/BANDAGES/DRESSINGS) ×1
BNDG CMPR 9X4 STRL LF SNTH (GAUZE/BANDAGES/DRESSINGS) ×1
BNDG CMPR STD VLCR NS LF 5.8X4 (GAUZE/BANDAGES/DRESSINGS) ×1
BNDG COHESIVE 3X5 TAN ST LF (GAUZE/BANDAGES/DRESSINGS) IMPLANT
BNDG ELASTIC 3INX 5YD STR LF (GAUZE/BANDAGES/DRESSINGS) ×1 IMPLANT
BNDG ELASTIC 4INX 5YD STR LF (GAUZE/BANDAGES/DRESSINGS) ×1 IMPLANT
BNDG ELASTIC 4X5.8 VLCR NS LF (GAUZE/BANDAGES/DRESSINGS) IMPLANT
BNDG ESMARK 4X9 LF (GAUZE/BANDAGES/DRESSINGS) ×1 IMPLANT
BNDG GAUZE DERMACEA FLUFF 4 (GAUZE/BANDAGES/DRESSINGS) ×1 IMPLANT
BNDG GZE DERMACEA 4 6PLY (GAUZE/BANDAGES/DRESSINGS) ×1
CHLORAPREP W/TINT 26 (MISCELLANEOUS) ×1 IMPLANT
COVER BACK TABLE 60X90IN (DRAPES) ×1 IMPLANT
COVER K-WIRE PIN .062/1.6 (PIN)
CUFF TOURN SGL QUICK 18X4 (TOURNIQUET CUFF) IMPLANT
CUFF TOURN SGL QUICK 24 (TOURNIQUET CUFF)
CUFF TRNQT CYL 24X4X16.5-23 (TOURNIQUET CUFF) IMPLANT
DRAPE 3/4 80X56 (DRAPES) ×1 IMPLANT
DRAPE C-ARM 35X43 STRL (DRAPES) IMPLANT
DRAPE EXTREMITY T 121X128X90 (DISPOSABLE) ×1 IMPLANT
DRAPE OEC MINIVIEW 54X84 (DRAPES) IMPLANT
DRAPE U-SHAPE 47X51 STRL (DRAPES) ×1 IMPLANT
ELECT REM PT RETURN 9FT ADLT (ELECTROSURGICAL) ×1
ELECTRODE REM PT RTRN 9FT ADLT (ELECTROSURGICAL) ×1 IMPLANT
GAUZE 4X4 16PLY ~~LOC~~+RFID DBL (SPONGE) ×1 IMPLANT
GAUZE SPONGE 4X4 12PLY STRL (GAUZE/BANDAGES/DRESSINGS) ×1 IMPLANT
GAUZE XEROFORM 1X8 LF (GAUZE/BANDAGES/DRESSINGS) ×1 IMPLANT
GLOVE BIO SURGEON STRL SZ7.5 (GLOVE) ×1 IMPLANT
GLOVE BIOGEL PI IND STRL 8 (GLOVE) ×1 IMPLANT
GOWN STRL REUS W/TWL LRG LVL3 (GOWN DISPOSABLE) ×1 IMPLANT
IMPL SCREW 3.0X18 (Screw) IMPLANT
IMPLANT SCREW 3.0X18 (Screw) ×1 IMPLANT
K-WIRE FIXATION CANN 1.1X120 (WIRE) ×2
K-WIRE SURGICAL 1.6X102 (WIRE) IMPLANT
KIT TURNOVER CYSTO (KITS) ×1 IMPLANT
KWIRE FIXATION CANN 1.1X120 (WIRE) IMPLANT
NDL HYPO 25X1 1.5 SAFETY (NEEDLE) ×1 IMPLANT
NEEDLE HYPO 25X1 1.5 SAFETY (NEEDLE) ×1 IMPLANT
NS IRRIG 1000ML POUR BTL (IV SOLUTION) IMPLANT
PACK BASIN DAY SURGERY FS (CUSTOM PROCEDURE TRAY) ×1 IMPLANT
PENCIL SMOKE EVACUATOR (MISCELLANEOUS) ×1 IMPLANT
PIN GUARD 1.57MM GREEN STERILE (PIN) IMPLANT
SLEEVE SCD COMPRESS KNEE MED (STOCKING) ×1 IMPLANT
STAPLER VISISTAT 35W (STAPLE) IMPLANT
STOCKINETTE 6 STRL (DRAPES) ×1 IMPLANT
SUCTION TUBE FRAZIER 10FR DISP (SUCTIONS) ×1 IMPLANT
SUT ETHILON 4 0 PS 2 18 (SUTURE) ×1 IMPLANT
SUT MNCRL AB 3-0 PS2 18 (SUTURE) ×1 IMPLANT
SUT MNCRL AB 4-0 PS2 18 (SUTURE) ×1 IMPLANT
SUT MON AB 5-0 PS2 18 (SUTURE) IMPLANT
SUT PROLENE 4 0 PS 2 18 (SUTURE) IMPLANT
SUT VIC AB 2-0 SH 27 (SUTURE)
SUT VIC AB 2-0 SH 27XBRD (SUTURE) ×1 IMPLANT
SYR BULB EAR ULCER 3OZ GRN STR (SYRINGE) ×1 IMPLANT
SYR CONTROL 10ML LL (SYRINGE) ×1 IMPLANT
TOWEL OR 17X24 6PK STRL BLUE (TOWEL DISPOSABLE) ×1 IMPLANT
TRAY DSU PREP LF (CUSTOM PROCEDURE TRAY) ×1 IMPLANT
UNDERPAD 30X36 HEAVY ABSORB (UNDERPADS AND DIAPERS) ×1 IMPLANT
YANKAUER SUCT BULB TIP NO VENT (SUCTIONS) IMPLANT

## 2023-04-18 NOTE — Anesthesia Preprocedure Evaluation (Addendum)
Anesthesia Evaluation  Patient identified by MRN, date of birth, ID band Patient awake    Reviewed: Allergy & Precautions, NPO status , Patient's Chart, lab work & pertinent test results  History of Anesthesia Complications (+) PONV and history of anesthetic complications  Airway Mallampati: II  TM Distance: >3 FB Neck ROM: Full    Dental no notable dental hx. (+) Teeth Intact, Dental Advisory Given   Pulmonary neg pulmonary ROS   Pulmonary exam normal breath sounds clear to auscultation       Cardiovascular negative cardio ROS Normal cardiovascular exam Rhythm:Regular Rate:Normal     Neuro/Psych negative neurological ROS  negative psych ROS   GI/Hepatic negative GI ROS, Neg liver ROS,,,  Endo/Other  Hypothyroidism    Renal/GU negative Renal ROS  negative genitourinary   Musculoskeletal negative musculoskeletal ROS (+)    Abdominal   Peds  Hematology  (+) Blood dyscrasia, anemia , REFUSES BLOOD PRODUCTS, JEHOVAH'S WITNESS  Anesthesia Other Findings   Reproductive/Obstetrics                             Anesthesia Physical Anesthesia Plan  ASA: 2  Anesthesia Plan: General and Regional   Post-op Pain Management: Regional block* and Tylenol PO (pre-op)*   Induction: Intravenous  PONV Risk Score and Plan: 4 or greater and Ondansetron, Dexamethasone, Midazolam and Treatment may vary due to age or medical condition  Airway Management Planned: LMA  Additional Equipment:   Intra-op Plan:   Post-operative Plan: Extubation in OR  Informed Consent: I have reviewed the patients History and Physical, chart, labs and discussed the procedure including the risks, benefits and alternatives for the proposed anesthesia with the patient or authorized representative who has indicated his/her understanding and acceptance.     Dental advisory given  Plan Discussed with: CRNA  Anesthesia Plan  Comments:        Anesthesia Quick Evaluation

## 2023-04-18 NOTE — Progress Notes (Signed)
Assisted Dr. Armond Hang with left, popliteal, ultrasound guided block. Side rails up, monitors on throughout procedure. See vital signs in flow sheet. Tolerated Procedure well.

## 2023-04-18 NOTE — Anesthesia Procedure Notes (Signed)
Anesthesia Regional Block: Popliteal block   Pre-Anesthetic Checklist: , timeout performed,  Correct Patient, Correct Site, Correct Laterality,  Correct Procedure, Correct Position, site marked,  Risks and benefits discussed,  Pre-op evaluation,  At surgeon's request and post-op pain management  Laterality: Left  Prep: Maximum Sterile Barrier Precautions used, chloraprep       Needles:  Injection technique: Single-shot  Needle Type: Echogenic Stimulator Needle     Needle Length: 9cm  Needle Gauge: 21     Additional Needles:   Procedures:,,,, ultrasound used (permanent image in chart),,    Narrative:  Start time: 04/18/2023 2:10 PM End time: 04/18/2023 2:13 PM Injection made incrementally with aspirations every 5 mL. Anesthesiologist: Elmer Picker, MD

## 2023-04-18 NOTE — Anesthesia Postprocedure Evaluation (Signed)
Anesthesia Post Note  Patient: Rebekah Atkins  Procedure(s) Performed: DOUBLE OSTEOTOMY; AUSTIN BUNIONECTOMY (Left: Toe)     Patient location during evaluation: PACU Anesthesia Type: Regional and MAC Level of consciousness: awake and alert and oriented Pain management: pain level controlled Vital Signs Assessment: post-procedure vital signs reviewed and stable Respiratory status: spontaneous breathing, nonlabored ventilation and respiratory function stable Cardiovascular status: stable and blood pressure returned to baseline Postop Assessment: no apparent nausea or vomiting Anesthetic complications: no   No notable events documented.  Last Vitals:  Vitals:   04/18/23 1600 04/18/23 1612  BP: (!) 105/59 (!) 102/57  Pulse: 85 95  Resp: 17 17  Temp:    SpO2: 99% 99%    Last Pain:  Vitals:   04/18/23 1612  TempSrc:   PainSc: 5                  Carvell Hoeffner A.

## 2023-04-18 NOTE — Anesthesia Procedure Notes (Signed)
Procedure Name: LMA Insertion Date/Time: 04/18/2023 2:35 PM  Performed by: Bishop Limbo, CRNAPre-anesthesia Checklist: Patient identified, Emergency Drugs available, Suction available and Patient being monitored Patient Re-evaluated:Patient Re-evaluated prior to induction Oxygen Delivery Method: Circle System Utilized Preoxygenation: Pre-oxygenation with 100% oxygen Induction Type: IV induction Ventilation: Mask ventilation without difficulty LMA: LMA inserted LMA Size: 4.0 Number of attempts: 1 Placement Confirmation: positive ETCO2 Tube secured with: Tape Dental Injury: Teeth and Oropharynx as per pre-operative assessment

## 2023-04-18 NOTE — Discharge Instructions (Addendum)
  Post Anesthesia Home Care Instructions  Activity: Get plenty of rest for the remainder of the day. A responsible individual must stay with you for 24 hours following the procedure.  For the next 24 hours, DO NOT: -Drive a car -Advertising copywriter -Drink alcoholic beverages -Take any medication unless instructed by your physician -Make any legal decisions or sign important papers.  Meals: Start with liquid foods such as gelatin or soup. Progress to regular foods as tolerated. Avoid greasy, spicy, heavy foods. If nausea and/or vomiting occur, drink only clear liquids until the nausea and/or vomiting subsides. Call your physician if vomiting continues.  Special Instructions/Symptoms: Your throat may feel dry or sore from the anesthesia or the breathing tube placed in your throat during surgery. If this causes discomfort, gargle with warm salt water. The discomfort should disappear within 24 hours.   Regional Anesthesia Blocks  1. You may not be able to move or feel the "blocked" extremity after a regional anesthetic block. This may last may last from 3-48 hours after placement, but it will go away. The length of time depends on the medication injected and your individual response to the medication. As the nerves start to wake up, you may experience tingling as the movement and feeling returns to your extremity. If the numbness and inability to move your extremity has not gone away after 48 hours, please call your surgeon.   2. The extremity that is blocked will need to be protected until the numbness is gone and the strength has returned. Because you cannot feel it, you will need to take extra care to avoid injury. Because it may be weak, you may have difficulty moving it or using it. You may not know what position it is in without looking at it while the block is in effect.  3. For blocks in the legs and feet, returning to weight bearing and walking needs to be done carefully. You will need to  wait until the numbness is entirely gone and the strength has returned. You should be able to move your leg and foot normally before you try and bear weight or walk. You will need someone to be with you when you first try to ensure you do not fall and possibly risk injury.  4. Bruising and tenderness at the needle site are common side effects and will resolve in a few days.  5. Persistent numbness or new problems with movement should be communicated to the surgeon.      No acetaminophen/Tylenol until after 6 pm today if needed.

## 2023-04-18 NOTE — Brief Op Note (Signed)
04/18/2023  3:32 PM  PATIENT:  Reita May Moten  42 y.o. female  PRE-OPERATIVE DIAGNOSIS:  BUNION LEFT FOOT  POST-OPERATIVE DIAGNOSIS:  BUNION LEFT FOOT  PROCEDURE:  Procedure(s) with comments: DOUBLE OSTEOTOMY; AUSTIN BUNIONECTOMY (Left) - LEG BLOCK  SURGEON:  Surgeons and Role:    * Vivi Barrack, DPM - Primary  PHYSICIAN ASSISTANT:   ASSISTANTS: none   ANESTHESIA:   general  EBL:  2 mL   BLOOD ADMINISTERED:none  DRAINS: none   LOCAL MEDICATIONS USED:  NONE  SPECIMEN:  No Specimen  DISPOSITION OF SPECIMEN:  N/A  COUNTS:  YES  TOURNIQUET:   Total Tourniquet Time Documented: Calf (Left) - 34 minutes Total: Calf (Left) - 34 minutes   DICTATION: .Reubin Milan Dictation  PLAN OF CARE: Discharge to home after PACU  PATIENT DISPOSITION:  PACU - hemodynamically stable.   Delay start of Pharmacological VTE agent (>24hrs) due to surgical blood loss or risk of bleeding: no  Intraoperative finding:  Underwent Austin bunionectomy, had good correction so I did not proceed with Akin. I discussed pre-op with the patient that was a possibility. Good fixation and osteotomy was stable.

## 2023-04-18 NOTE — Interval H&P Note (Signed)
History and Physical Interval Note:  04/18/2023 1:38 PM  Rebekah Atkins  has presented today for surgery, with the diagnosis of BUNION LEFT FOOT.  The various methods of treatment have been discussed with the patient and family. After consideration of risks, benefits and other options for treatment, the patient has consented to  Procedure(s) with comments: DOUBLE OSTEOTOMY; AUSTIN AND AKIN BUNIONECTOMY (Left) - LEG BLOCK as a surgical intervention.  The patient's history has been reviewed, patient examined, no change in status, stable for surgery.  I have reviewed the patient's chart and labs.  Questions were answered to the patient's satisfaction.    Discussed possible Akin.    Vivi Barrack

## 2023-04-18 NOTE — Transfer of Care (Signed)
Immediate Anesthesia Transfer of Care Note  Patient: Rebekah Atkins  Procedure(s) Performed: DOUBLE OSTEOTOMY; AUSTIN BUNIONECTOMY (Left: Toe)  Patient Location: PACU  Anesthesia Type:General and Regional  Level of Consciousness: awake, alert , oriented, and patient cooperative  Airway & Oxygen Therapy: Patient Spontanous Breathing and Patient connected to nasal cannula oxygen  Post-op Assessment: Report given to RN and Post -op Vital signs reviewed and stable  Post vital signs: Reviewed and stable  Last Vitals:  Vitals Value Taken Time  BP 112/62 04/18/23 1535  Temp    Pulse 104 04/18/23 1536  Resp 18 04/18/23 1536  SpO2 100 % 04/18/23 1536  Vitals shown include unfiled device data.  Last Pain:  Vitals:   04/18/23 1205  TempSrc: Oral  PainSc: 0-No pain      Patients Stated Pain Goal: 6 (04/18/23 1205)  Complications: No notable events documented.

## 2023-04-19 ENCOUNTER — Encounter: Payer: Medicaid Other | Admitting: Podiatry

## 2023-04-19 DIAGNOSIS — M21612 Bunion of left foot: Secondary | ICD-10-CM

## 2023-04-19 NOTE — Op Note (Signed)
PATIENT:  Rebekah Atkins  42 y.o. female   PRE-OPERATIVE DIAGNOSIS:  BUNION LEFT FOOT   POST-OPERATIVE DIAGNOSIS:  BUNION LEFT FOOT   PROCEDURE:  Procedure(s) with comments: DOUBLE OSTEOTOMY; AUSTIN BUNIONECTOMY (Left) - LEG BLOCK   SURGEON:  Surgeons and Role:    * Vivi Barrack, DPM - Primary   PHYSICIAN ASSISTANT:    ASSISTANTS: none    ANESTHESIA:   general   EBL:  2 mL    BLOOD ADMINISTERED:none   DRAINS: none    LOCAL MEDICATIONS USED:  NONE   SPECIMEN:  No Specimen   DISPOSITION OF SPECIMEN:  N/A   COUNTS:  YES   TOURNIQUET:   Total Tourniquet Time Documented: Calf (Left) - 34 minutes Total: Calf (Left) - 34 minutes     DICTATION: .Reubin Milan Dictation   PLAN OF CARE: Discharge to home after PACU   PATIENT DISPOSITION:  PACU - hemodynamically stable.   Delay start of Pharmacological VTE agent (>24hrs) due to surgical blood loss or risk of bleeding: no   Intraoperative finding:  Underwent Austin bunionectomy, had good correction so I did not proceed with Akin. I discussed pre-op with the patient that was a possibility. Good fixation and osteotomy was stable.    Indications for surgery: 42 year old female presents the office with concerns of painful bunions on both feet.  Although the right side looks worse the left side is been causing more pain.  She is attempted a course of conservative treatment significant improvement.  She tried humidification, operative time and significant improvement.  She was proceed with surgical invention.  Discussed option, possible Akin bunionectomy if needed.  All alternatives, risks, complications were discussed.  No promises or guarantees were given second the procedure and all questions were answered to the best my ability.  Procedure in detail: The patient is a verbally and visually identified by myself, nursing staff, anesthesia staff preoperatively.  Nerve block was performed by anesthesia preoperatively as well.  She  was then transferred to the operating room via stretcher and placed on table in supine position.  After adequate plane of anesthesia was obtained left lower extremity and scrubbed, prepped, draped normal sterile fashion.  Timeout was performed.  The left lower extremity was exsanguinated with an Esmarch bandage and pneumatic ankle tourniquet was inflated to 250 mmHg.  At this time a linear incision was made medial to the extensor tendon with a 15 blade scalpel along the distal portion of the first metatarsal.  Incision was made with a #15 blade scalpel through the epidermis and dermis.  The subcutaneous tissues were both bluntly and sharply dissected making sure retract all vital neurovascular structures all bleeders were cauterized necessary.  At this time interspace release was performed.  Once the release was formed there is improved range of motion first MTPJ.  Capsular incision was made along the dorsal distally and soft tissue structures freed from the metatarsal head.  Sagittal saw was utilized to resect the medial eminence.  I then created osteotomy point osteotomy.  The capital fragment was distracted and shifted laterally intercarpal position and pinned for an Integra 3.0 millimeter screw.  Fluoroscopy was asking for placement.  Secondary K wire inserted for temporary fixation.  An 18 mm 3.0 Integra screw was inserted over the K wire to compression.  Guidewire was removed.  Fluoroscopy was utilized to confirm adequate placement of hardware.  Osteotomy was stable.  I resected the remaining overhanging bone on the medial aspect and the rough edges  were contoured.  At the time atorvastatin present position size not pursue a 70..  Incision was saline hemostasis achieved.  Incision was closed in layered fashion with deep structures closed with 3-0 Monocryl followed by the subcu tissues with 4-0 Monocryl and skin was closed with 5-0 Monocryl in running subcuticular fashion.  Xeroform was applied followed by  dry sterile dressing.  The tourniquet was then released and found to be an immediate capillary fill time to all the digits.  She was woken from anesthesia and found to have tolerated the procedure well any complications.  Transferred to PACU vital sign stable vascular status intact.

## 2023-04-20 ENCOUNTER — Encounter (HOSPITAL_BASED_OUTPATIENT_CLINIC_OR_DEPARTMENT_OTHER): Payer: Self-pay | Admitting: Podiatry

## 2023-04-24 ENCOUNTER — Encounter: Payer: Self-pay | Admitting: Podiatry

## 2023-04-24 ENCOUNTER — Ambulatory Visit (INDEPENDENT_AMBULATORY_CARE_PROVIDER_SITE_OTHER): Payer: Medicaid Other | Admitting: Podiatry

## 2023-04-24 ENCOUNTER — Ambulatory Visit (INDEPENDENT_AMBULATORY_CARE_PROVIDER_SITE_OTHER): Payer: Medicaid Other

## 2023-04-24 VITALS — Ht 67.0 in | Wt 156.0 lb

## 2023-04-24 DIAGNOSIS — M21612 Bunion of left foot: Secondary | ICD-10-CM

## 2023-04-24 DIAGNOSIS — Z9889 Other specified postprocedural states: Secondary | ICD-10-CM

## 2023-04-24 DIAGNOSIS — M21619 Bunion of unspecified foot: Secondary | ICD-10-CM

## 2023-04-24 NOTE — Progress Notes (Signed)
Subjective: Chief Complaint  Patient presents with   Routine Post Op    RM13: POV #1 DOS 04/04/2023 LT FOOT SURGICAL CORRECTION OF BUNION (AUSTIN, POSS AIKEN    42 year old female presents the above concerns for postop visit #1 status post I&D.  States that she been doing well.  She did not wear the cam boot.  No injuries.  Denies any fevers or chills.  No other concerns.  Objective: AAO x3, NAD DP/PT pulses palpable bilaterally, CRT less than 3 seconds Incision well coapted with sutures intact.  There is mild edema.  Minimal ecchymosis present.  There is no evidence of dehiscence.  No surrounding erythema, ascending size.  No drainage or pus.  No fluctuation or crepitation or any signs of infection noted otherwise.  Toe is rectus. No pain with calf compression, swelling, warmth, erythema  Assessment: Status post bunionectomy, healing well  Plan: -All treatment options discussed with the patient including all alternatives, risks, complications.  -X-rays obtained reviewed.  3 views of the foot were obtained.  No evidence of acute fracture.  Hardware intact with any complicating factors. -Small amount of antibiotic ointment is applied followed by dressing.  Keep dressing clean, dry, intact. -Continue cam boot.  Weight-bear as tolerated although limited.  Ice, elevation -Pain medication as needed. -Monitor for any clinical signs or symptoms of infection and directed to call the office immediately should any occur or go to the ER. -Patient encouraged to call the office with any questions, concerns, change in symptoms.   Vivi Barrack DPM

## 2023-05-03 ENCOUNTER — Encounter: Payer: Self-pay | Admitting: Podiatry

## 2023-05-03 ENCOUNTER — Ambulatory Visit (INDEPENDENT_AMBULATORY_CARE_PROVIDER_SITE_OTHER): Payer: Medicaid Other

## 2023-05-03 ENCOUNTER — Ambulatory Visit (INDEPENDENT_AMBULATORY_CARE_PROVIDER_SITE_OTHER): Payer: Medicaid Other | Admitting: Podiatry

## 2023-05-03 DIAGNOSIS — M21619 Bunion of unspecified foot: Secondary | ICD-10-CM

## 2023-05-03 DIAGNOSIS — Z9889 Other specified postprocedural states: Secondary | ICD-10-CM

## 2023-05-06 NOTE — Progress Notes (Signed)
Subjective: Chief Complaint  Patient presents with   Routine Post Op    POV#3 patient states foot feels tight and painful    42 year old female presents the above concerns for postop visit #2 status post Austin bunionectomy. States that she been doing well.  She did not wear the cam boot.  She is on her feet and she has had to use it more than she should.  She is the sole driver in her house and she been driving as well.  He is not taking any narcotics, or really any over-the-counter anti-inflammatories.  Objective: AAO x3, NAD DP/PT pulses palpable bilaterally, CRT less than 3 seconds Incision well coapted with sutures intact.  There is mild edema without any erythema or warmth.  No drainage.  No sign of infection.  Tenderness palpation feeling the surgical site.  Toes are rectus. No pain with calf compression, swelling, warmth, erythema  Assessment: Status post bunionectomy, healing well  Plan: -All treatment options discussed with the patient including all alternatives, risks, complications.  -Incision is healing well.  Small amount of antibiotic ointment is applied followed by dressing.  She will start to wash it with soap and water, dry thoroughly apply similar bandage. -Continue cam boot.  Weight-bear as tolerated although limited.  Ice, elevation -Anti-inflammatories as needed With pain but also swelling. -Pain medication as needed. -Monitor for any clinical signs or symptoms of infection and directed to call the office immediately should any occur or go to the ER. -Patient encouraged to call the office with any questions, concerns, change in symptoms.   X-ray next appointment  Vivi Barrack DPM

## 2023-05-17 ENCOUNTER — Ambulatory Visit (INDEPENDENT_AMBULATORY_CARE_PROVIDER_SITE_OTHER): Payer: Medicaid Other | Admitting: Podiatry

## 2023-05-17 ENCOUNTER — Ambulatory Visit (INDEPENDENT_AMBULATORY_CARE_PROVIDER_SITE_OTHER): Payer: Medicaid Other

## 2023-05-17 DIAGNOSIS — M21619 Bunion of unspecified foot: Secondary | ICD-10-CM

## 2023-05-17 DIAGNOSIS — M7752 Other enthesopathy of left foot: Secondary | ICD-10-CM

## 2023-05-17 DIAGNOSIS — M778 Other enthesopathies, not elsewhere classified: Secondary | ICD-10-CM

## 2023-05-17 NOTE — Progress Notes (Signed)
Subjective: No chief complaint on file.   42 year old female presents today for follow-up status post Austin bunionectomy. States that she been doing well and the incision is looking better. She gets some numbness/tingling to the other toes/forefoot. Swelling is better. No other concerns today.   Objective: AAO x3, NAD DP/PT pulses palpable bilaterally, CRT less than 3 seconds Incision well coapted with sutures intact.  Suture ends are intact on either end. There is decreased edema. There is no erythema or warmth.  No drainage or pus.  No signs of infection.  Again there are some tingling, mild tenderness on the lesser digits, forefoot but no specific area pinpoint tenderness. No pain with calf compression, swelling, warmth, erythema  Assessment: Status post bunionectomy, healing well  Plan: -All treatment options discussed with the patient including all alternatives, risks, complications.  -X-ray obtained reviewed.  3 views of the foot were obtained.  Hardware intact status post bunionectomy with increased consolidation across the osteotomy site.  No evidence of acute fracture. -Will start to transition to surgical shoe as tolerated.  Continue ice, elevate as well as compression.  Will refer to physical therapy to start range of motion.  I like for her to remain in surgical shoe to see her back in 2 weeks and at that point we will likely transition to regular shoe as tolerated. -Surgical shoe dispensed to help facilitate osseous/soft tissue healing.  I expect the swelling to continue improving overall looks better today.  Expect that the tingling that she is getting to the other toes to improve over time.  Return in about 2 weeks (around 05/31/2023).  X-ray next appointment  Vivi Barrack DPM

## 2023-05-29 ENCOUNTER — Ambulatory Visit: Payer: Medicaid Other | Admitting: Podiatry

## 2023-05-30 ENCOUNTER — Telehealth: Payer: Self-pay | Admitting: Podiatry

## 2023-06-05 ENCOUNTER — Ambulatory Visit: Payer: Medicaid Other | Admitting: Podiatry

## 2023-06-05 ENCOUNTER — Ambulatory Visit (INDEPENDENT_AMBULATORY_CARE_PROVIDER_SITE_OTHER): Payer: Medicaid Other

## 2023-06-05 DIAGNOSIS — M21612 Bunion of left foot: Secondary | ICD-10-CM | POA: Diagnosis not present

## 2023-06-05 DIAGNOSIS — M21619 Bunion of unspecified foot: Secondary | ICD-10-CM

## 2023-06-10 NOTE — Progress Notes (Signed)
Subjective: No chief complaint on file.    42 year old female presents today for follow-up status post Austin bunionectomy.  She has been doing well.  She is back to wearing her regular style shoe.  No significant pain.  Swelling is improving.  No fever or chills.  No other concerns.    Objective: AAO x3, NAD DP/PT pulses palpable bilaterally, CRT less than 3 seconds Incision well coapted without any evidence of dehiscence.  Scar is forming.  There is decreased edema and some mild edema remains although improving.  There is no surrounding erythema, ascending cellulitis there is no drainage or pus or any signs of infection.  No significant pain on exam.   No pain with calf compression, swelling, warmth, erythema  Assessment: Status post bunionectomy, healing well  Plan: -All treatment options discussed with the patient including all alternatives, risks, complications.  -X-ray obtained reviewed.  3 views of the foot were obtained.  Hardware intact status post bunionectomy with increased consolidation across the osteotomy site.  No evidence of acute fracture. -She can continue with regular shoe as tolerated.  When she is back into a regular sneaker, shoe full-time and gradually increase activity level as tolerated.  Physical therapy.  She will call to get scheduled for this.  Ice, elevation as well as compression of any residual edema.  Return in about 2 months (around 08/06/2023) for post-op, x-ray.  Vivi Barrack DPM

## 2023-06-13 ENCOUNTER — Other Ambulatory Visit: Payer: Self-pay | Admitting: Podiatry

## 2023-06-13 ENCOUNTER — Encounter: Payer: Self-pay | Admitting: Podiatry

## 2023-06-13 DIAGNOSIS — M21619 Bunion of unspecified foot: Secondary | ICD-10-CM

## 2023-06-14 ENCOUNTER — Ambulatory Visit: Payer: Medicaid Other | Admitting: Physical Therapy

## 2023-06-14 NOTE — Therapy (Incomplete)
M21.619 (ICD-10-CM) - Bunion     Rebekah Atkins, DPM Ref Provider   OUTPATIENT PHYSICAL THERAPY LOWER EXTREMITY EVALUATION   Patient Name: Rebekah Atkins MRN: [id number] DOB:March 14, 1981, 42 y.o., female Today's Date: 06/14/2023  END OF SESSION:   Past Medical History:  Diagnosis Date   Hypothyroidism due to Hashimoto thyroiditis    endocridologist--- dr Rebekah Atkins;   labs normalized , no meds   IDA (iron deficiency anemia)    IDA (iron deficiency anemia)    PONV (postoperative nausea and vomiting)    Transfusion of blood product refused for religious reason    Jehovah Witness   Varicose vein of leg    Wears contact lenses    Past Surgical History:  Procedure Laterality Date   BUNIONECTOMY Left 04/18/2023   Procedure: DOUBLE OSTEOTOMY; Rebekah Atkins;  Surgeon: Rebekah Atkins, DPM;  Location: [facility];  Service: Podiatry;  Laterality: Left;  LEG BLOCK   DILITATION & CURRETTAGE/HYSTROSCOPY WITH NOVASURE ABLATION N/A 11/21/2022   Procedure: DILATATION & CURETTAGE/HYSTEROSCOPY WITH MYOSURE; NOVASURE ABLATION;  Surgeon: Rebekah Bears, MD;  Location: [facility];  Service: Gynecology;  Laterality: N/A;   LUMBAR DISC SURGERY  11/15/2018   @[facility] --Rebekah Atkins;  L5--S1   VARICOSE VEIN SURGERY  04/2019   laser ablation Right GSV , RLE   Patient Active Problem List   Diagnosis Date Noted   Bunion, left 04/19/2023   Cervical polyp 11/21/2022   Acne 09/24/2022   Hashimoto's thyroiditis 09/01/2022   Abnormal uterine bleeding (AUB) 04/18/2022   Menorrhagia with regular cycle 04/18/2022   S/P lumbar discectomy 11/15/2018   Refusal of blood transfusions as patient is Jehovah's Witness 11/12/2018    PCP: Rebekah Has, MD PCP - General  REFERRING PROVIDER: Vivi Atkins, DPM Ref Provider    REFERRING DIAG: M21.619 (ICD-10-CM) - Bunion   THERAPY DIAG:  No diagnosis found.  Rationale for Evaluation and Treatment:  {HABREHAB:27488}  ONSET DATE: ***  SUBJECTIVE:   SUBJECTIVE STATEMENT: ***  PERTINENT HISTORY: *** PAIN:  Are you having pain? {OPRCPAIN:27236}  PRECAUTIONS: {Therapy precautions:24002}  RED FLAGS: {PT Red Flags:29287}   WEIGHT BEARING RESTRICTIONS: {Yes ***/No:24003}  FALLS:  Atkins patient fallen in last 6 months? {fallsyesno:27318}  LIVING ENVIRONMENT: Lives with: {OPRC lives with:25569::"lives with their family"} Lives in: {Lives in:25570} Stairs: {opstairs:27293} Atkins following equipment at home: {Assistive devices:23999}  OCCUPATION: ***  PLOF: {PLOF:24004}  PATIENT GOALS: ***  NEXT MD VISIT: ***  OBJECTIVE:  Note: Objective measures were completed at Evaluation unless otherwise noted.  DIAGNOSTIC FINDINGS: ***  PATIENT SURVEYS:  {rehab surveys:24030}  COGNITION: Overall cognitive status: {cognition:24006}     SENSATION: {sensation:27233}  EDEMA:  {edema:24020}  MUSCLE LENGTH: Hamstrings: Right *** deg; Left *** deg Rebekah Atkins test: Right *** deg; Left *** deg  POSTURE: {posture:25561}  PALPATION: ***  LOWER EXTREMITY ROM:  {AROM/PROM:27142} ROM Right eval Left eval  Hip flexion    Hip extension    Hip abduction    Hip adduction    Hip internal rotation    Hip external rotation    Knee flexion    Knee extension    Ankle dorsiflexion    Ankle plantarflexion    Ankle inversion    Ankle eversion     (Blank rows = not tested)  LOWER EXTREMITY MMT:  MMT Right eval Left eval  Hip flexion    Hip extension    Hip abduction    Hip adduction    Hip internal  rotation    Hip external rotation    Knee flexion    Knee extension    Ankle dorsiflexion    Ankle plantarflexion    Ankle inversion    Ankle eversion     (Blank rows = not tested)  LOWER EXTREMITY SPECIAL TESTS:  {LEspecialtests:26242}  FUNCTIONAL TESTS:  {Functional tests:24029}  GAIT: Distance walked: *** Assistive device utilized: {Assistive devices:23999} Level  of assistance: {Levels of assistance:24026} Comments: ***   TODAY'S TREATMENT:                                                                                                                              DATE: ***    PATIENT EDUCATION:  Education details: *** Person educated: {Person educated:25204} Education method: {Education Method:25205} Education comprehension: {Education Comprehension:25206}  HOME EXERCISE PROGRAM: ***  ASSESSMENT:  CLINICAL IMPRESSION: Patient is a *** y.o. *** who was seen today for physical therapy evaluation and treatment for ***.   OBJECTIVE IMPAIRMENTS: {opptimpairments:25111}.   ACTIVITY LIMITATIONS: {activitylimitations:27494}  PARTICIPATION LIMITATIONS: {participationrestrictions:25113}  PERSONAL FACTORS: {Personal factors:25162} are also affecting patient's functional outcome.   REHAB POTENTIAL: {rehabpotential:25112}  CLINICAL DECISION MAKING: {clinical decision making:25114}  EVALUATION COMPLEXITY: {Evaluation complexity:25115}   GOALS: Goals reviewed with patient? {yes/no:20286}  SHORT TERM GOALS: Target date: *** *** Baseline: Goal status: INITIAL  2.  *** Baseline:  Goal status: INITIAL  3.  *** Baseline:  Goal status: INITIAL  4.  *** Baseline:  Goal status: INITIAL  5.  *** Baseline:  Goal status: INITIAL  6.  *** Baseline:  Goal status: INITIAL  LONG TERM GOALS: Target date: ***  *** Baseline:  Goal status: INITIAL  2.  *** Baseline:  Goal status: INITIAL  3.  *** Baseline:  Goal status: INITIAL  4.  *** Baseline:  Goal status: INITIAL  5.  *** Baseline:  Goal status: INITIAL  6.  *** Baseline:  Goal status: INITIAL   PLAN:  PT FREQUENCY: {rehab frequency:25116}  PT DURATION: {rehab duration:25117}  PLANNED INTERVENTIONS: {rehab planned interventions:25118::"97110-Therapeutic exercises","97530- Therapeutic [phone]- Neuromuscular re-education","97535- Self NGEX","52841-  Manual therapy"}  PLAN FOR NEXT SESSION: ***   Rebekah Atkins, PT 06/14/2023, 12:03 PM

## 2023-06-19 ENCOUNTER — Encounter (HOSPITAL_BASED_OUTPATIENT_CLINIC_OR_DEPARTMENT_OTHER): Payer: Self-pay | Admitting: Physical Therapy

## 2023-06-19 ENCOUNTER — Ambulatory Visit (HOSPITAL_BASED_OUTPATIENT_CLINIC_OR_DEPARTMENT_OTHER): Payer: Medicaid Other | Attending: Podiatry | Admitting: Physical Therapy

## 2023-06-19 DIAGNOSIS — R2689 Other abnormalities of gait and mobility: Secondary | ICD-10-CM

## 2023-06-19 DIAGNOSIS — M79672 Pain in left foot: Secondary | ICD-10-CM | POA: Diagnosis present

## 2023-06-19 DIAGNOSIS — M21619 Bunion of unspecified foot: Secondary | ICD-10-CM | POA: Insufficient documentation

## 2023-06-19 NOTE — Therapy (Signed)
OUTPATIENT PHYSICAL THERAPY LOWER EXTREMITY EVALUATION   Patient Name: Rebekah Atkins MRN: [id number] DOB:12-08-1980, 42 y.o., female Today's Date: 06/20/2023  END OF SESSION:  PT End of Session - 06/20/23 1205     Visit Number 1    Number of Visits 16    Date for PT Re-Evaluation 08/15/23    PT Start Time 0800    PT Stop Time 0843    PT Time Calculation (min) 43 min    Activity Tolerance Patient tolerated treatment well    Behavior During Therapy [facility] for tasks assessed/performed             Past Medical History:  Diagnosis Date   Hypothyroidism due to Hashimoto thyroiditis    endocridologist--- dr Talmage Nap;   labs normalized , no meds   IDA (iron deficiency anemia)    IDA (iron deficiency anemia)    PONV (postoperative nausea and vomiting)    Transfusion of blood product refused for religious reason    Jehovah Witness   Varicose vein of leg    Wears contact lenses    Past Surgical History:  Procedure Laterality Date   BUNIONECTOMY Left 04/18/2023   Procedure: DOUBLE OSTEOTOMY; Serafina Royals;  Surgeon: Vivi Barrack, DPM;  Location: [facility];  Service: Podiatry;  Laterality: Left;  LEG BLOCK   DILITATION & CURRETTAGE/HYSTROSCOPY WITH NOVASURE ABLATION N/A 11/21/2022   Procedure: DILATATION & CURETTAGE/HYSTEROSCOPY WITH MYOSURE; NOVASURE ABLATION;  Surgeon: Jerene Bears, MD;  Location: [facility] [place];  Service: Gynecology;  Laterality: N/A;   LUMBAR DISC SURGERY  11/15/2018   @[facility] [name];  L5--S1   VARICOSE VEIN SURGERY  04/2019   laser ablation Right GSV , RLE   Patient Active Problem List   Diagnosis Date Noted   Bunion, left 04/19/2023   Cervical polyp 11/21/2022   Acne 09/24/2022   Hashimoto's thyroiditis 09/01/2022   Abnormal uterine bleeding (AUB) 04/18/2022   Menorrhagia with regular cycle 04/18/2022   S/P lumbar discectomy 11/15/2018   Refusal of blood transfusions as patient is Jehovah's  Witness 11/12/2018    PCP: Dr Jillyn Ledger   REFERRING PROVIDER: Dr Jillyn Ledger   REFERRING DIAG:   THERAPY DIAG:  Pain in left foot  Other abnormalities of gait and mobility  Rationale for Evaluation and Treatment: Rehabilitation  ONSET DATE: DOS: 04/18/2023  SUBJECTIVE:   SUBJECTIVE STATEMENT: Patient had a bunionectomy on Left 04/18/2023.   PERTINENT HISTORY: Hashimoto thyroiditis, right bunion. L/S surgery  PAIN:  Are you having pain? Yes: NPRS scale: 0/10 2/10 at worst over the past week  Pain location: left great toe Pain description: aching Aggravating factors: walking; flexion/ extension of the toe  Relieving factors: not moving the toe   PRECAUTIONS: None  RED FLAGS: None   WEIGHT BEARING RESTRICTIONS: No  FALLS:  Has patient fallen in last 6 months? No  LIVING ENVIRONMENT: 1 step up into the house.  OCCUPATION:  Unemployed   Hobbies:  Walking at the park Swimming  Classes at the gym   PLOF: Independent  PATIENT GOALS:  To get back to activity   NEXT MD VISIT:  2 months   OBJECTIVE:  Note: Objective measures were completed at Evaluation unless otherwise noted.  DIAGNOSTIC FINDINGS:  Nothing post op   PATIENT SURVEYS:  LEFS 66/80   COGNITION: Overall cognitive status: Within functional limits for tasks assessed     SENSATION: WFL  EDEMA:  Edema around the first met head  POSTURE: No Significant postural limitations  PALPATION: No significant TTP 2nd to numbness   LOWER EXTREMITY ROM:  Passive ROM Right eval Left eval  Hip flexion    Hip extension    Hip abduction    Hip adduction    Hip internal rotation    Hip external rotation    Knee flexion    Knee extension    Ankle dorsiflexion 17 10  Ankle plantarflexion    Ankle inversion WNL WNL  Ankle eversion WNL WNL   (Blank rows = not tested) Mild limitations in great toe flexion/extension   LOWER EXTREMITY MMT:  MMT Right eval Left eval  Hip  flexion    Hip extension    Hip abduction    Hip adduction    Hip internal rotation    Hip external rotation    Knee flexion    Knee extension    Ankle dorsiflexion    Ankle plantarflexion    Ankle inversion    Ankle eversion     (Blank rows = not tested) to be tested next visit    FUNCTIONAL TESTS:  Single leg stance: mild instability bilateral  GAIT: Supination bilateral with ambualtion, more exaggerated with shoes off    TODAY'S TREATMENT:                                                                                                                              DATE:   Reviewed potential shoe ideas  Reviewed self great toe mobilization    Access Code: [email or web address] URL: [email or web address] Date: 06/19/2023 Prepared by: Lorayne Bender  Exercises - Seated Heel Raise ( with graded pressure)   - 1 x daily - 7 x weekly - 3 sets - 10 reps - Seated Ankle Inversion with Resistance and Legs Crossed  - 1 x daily - 7 x weekly - 3 sets - 10 reps - Seated Ankle Eversion with Resistance  - 1 x daily - 7 x weekly - 3 sets - 10 reps - Standing Gastroc Stretch at Counter  - 1 x daily - 7 x weekly - 3 sets - 10 reps - Seated Gastroc Stretch with Strap  - 1 x daily - 7 x weekly - 3 sets - 10 reps - Seated Self Great Toe Stretch  - 1 x daily - 7 x weekly - 3 sets - 10 reps  PATIENT EDUCATION:  Education details: HEP, symptom management, progression of activity  Person educated: Patient Education method: Explanation, Demonstration, Tactile cues, Verbal cues, and Handouts Education comprehension: verbalized understanding, returned demonstration, verbal cues required, tactile cues required, and needs further education  HOME EXERCISE PROGRAM: Access Code: [email or web address] URL: [email or web address] Date: 06/19/2023 Prepared by: Lorayne Bender  Exercises - Seated Heel Raise  - 1 x daily - 7 x weekly - 3 sets - 10 reps - Seated Ankle Inversion with Resistance and Legs  Crossed  - 1 x daily - 7 x weekly - 3 sets -  10 reps - Seated Ankle Eversion with Resistance  - 1 x daily - 7 x weekly - 3 sets - 10 reps - Standing Gastroc Stretch at Counter  - 1 x daily - 7 x weekly - 3 sets - 10 reps - Seated Gastroc Stretch with Strap  - 1 x daily - 7 x weekly - 3 sets - 10 reps - Seated Self Great Toe Stretch  - 1 x daily - 7 x weekly - 3 sets - 10 reps  ASSESSMENT:  CLINICAL IMPRESSION: Patient is a 42 year old female status post left bunionectomy and first metatarsal on 04/18/2023.  She has progressed to the surgical shoe at this time.  She reports minimal pain at this time.  She has most pain when she is going up and down stairs and walking fast.  She was active prior to surgery.  She would like to return to running and return to the gym.  She would benefit from skilled therapy to improve single-leg stability, great toe mobility, and general ability to ambulate.  OBJECTIVE IMPAIRMENTS: Abnormal gait, decreased activity tolerance, decreased endurance, decreased mobility, difficulty walking, decreased ROM, decreased strength, increased edema, and pain.   ACTIVITY LIMITATIONS: carrying, standing, squatting, stairs, transfers, and locomotion level  PARTICIPATION LIMITATIONS: meal prep, cleaning, laundry, shopping, community activity, occupation, yard work, and gym activity  PERSONAL FACTORS: 1-2 comorbidities: right bunion; L/S surgery   are also affecting patient's functional outcome.   REHAB POTENTIAL: Good  CLINICAL DECISION MAKING: Stable/uncomplicated  EVALUATION COMPLEXITY: Low   GOALS: Goals reviewed with patient? Yes  SHORT TERM GOALS: Target date: 07/17/2023   Patient willd emonstrate full DF  Baseline: Goal status: INITIAL  2.  Patient will demonstrate normal great toe mobility  Baseline:  Goal status: INITIAL  3.  Patient will go be independent with base HEP  Baseline:  Goal status: INITIAL   LONG TERM GOALS: Target date:  08/14/2023    Patient will return to running when she is cleared by MD and has the stability  Baseline:  Goal status: INITIAL  2.  Patient will return to full gym routine  Baseline:  Goal status: INITIAL  3.  Patient will go up/down 8 steps with reciprocal gait pattern  Baseline:  Goal status: INITIAL    PLAN:  PT FREQUENCY: 2x/week  PT DURATION: 8 weeks   PLANNED INTERVENTIONS:  .[id number]-Therapeutic exercises, 97530- Therapeutic activity, [id number]- Neuromuscular re-education, 97535- Self Care, [id number]- Manual therapy, [id number]- Gait training, [id number]- Aquatic Therapy, 97014- Electrical stimulation (unattended), 97035- Ultrasound, Patient/Family education, Stair training, Taping, Dry Needling,instructions, Cryotherapy, and Moist heat   PLAN FOR NEXT SESSION:  Review HEP. Progress to weight bearing exercises. Consider low march with progression to single leg stance. Progress to air-ex. Continue with manual therapy for the great tow. Consider exercise bike.   Dessie Coma, PT 06/20/2023, 12:07 PM

## 2023-06-20 ENCOUNTER — Encounter (HOSPITAL_BASED_OUTPATIENT_CLINIC_OR_DEPARTMENT_OTHER): Payer: Self-pay | Admitting: Physical Therapy

## 2023-06-29 ENCOUNTER — Encounter (HOSPITAL_BASED_OUTPATIENT_CLINIC_OR_DEPARTMENT_OTHER): Payer: Self-pay | Admitting: Obstetrics & Gynecology

## 2023-06-29 ENCOUNTER — Other Ambulatory Visit (HOSPITAL_BASED_OUTPATIENT_CLINIC_OR_DEPARTMENT_OTHER): Payer: Self-pay | Admitting: Obstetrics & Gynecology

## 2023-06-29 DIAGNOSIS — N6320 Unspecified lump in the left breast, unspecified quadrant: Secondary | ICD-10-CM

## 2023-07-02 ENCOUNTER — Ambulatory Visit: Payer: Medicaid Other

## 2023-07-05 ENCOUNTER — Encounter (HOSPITAL_BASED_OUTPATIENT_CLINIC_OR_DEPARTMENT_OTHER): Payer: Self-pay

## 2023-07-05 ENCOUNTER — Ambulatory Visit (HOSPITAL_BASED_OUTPATIENT_CLINIC_OR_DEPARTMENT_OTHER): Payer: Medicaid Other | Attending: Podiatry

## 2023-07-05 DIAGNOSIS — R2689 Other abnormalities of gait and mobility: Secondary | ICD-10-CM | POA: Insufficient documentation

## 2023-07-05 DIAGNOSIS — M79672 Pain in left foot: Secondary | ICD-10-CM | POA: Diagnosis present

## 2023-07-05 NOTE — Therapy (Signed)
 OUTPATIENT PHYSICAL THERAPY LOWER EXTREMITY TREATMENT   Patient Name: Rebekah Atkins MRN: [id number] DOB:01-03-1981, 43 y.o., female Today's Date: 07/05/2023  END OF SESSION:  PT End of Session - 07/05/23 0800     Visit Number 2    Number of Visits 16    Date for PT Re-Evaluation 08/15/23    PT Start Time 0802    PT Stop Time 0850    PT Time Calculation (min) 48 min    Activity Tolerance Patient tolerated treatment well    Behavior During Therapy [facility] for tasks assessed/performed              Past Medical History:  Diagnosis Date   Hypothyroidism due to Hashimoto thyroiditis    endocridologist--- dr tommas;   labs normalized , no meds   IDA (iron deficiency anemia)    IDA (iron deficiency anemia)    PONV (postoperative nausea and vomiting)    Transfusion of blood product refused for religious reason    Jehovah Witness   Varicose vein of leg    Wears contact lenses    Past Surgical History:  Procedure Laterality Date   BUNIONECTOMY Left 04/18/2023   Procedure: DOUBLE OSTEOTOMY; MASSIE EDWARDS;  Surgeon: Gershon Donnice JONELLE, DPM;  Location: [facility];  Service: Podiatry;  Laterality: Left;  LEG BLOCK   DILITATION & CURRETTAGE/HYSTROSCOPY WITH NOVASURE ABLATION N/A 11/21/2022   Procedure: DILATATION & CURETTAGE/HYSTEROSCOPY WITH MYOSURE; NOVASURE ABLATION;  Surgeon: Cleotilde Ronal RAMAN, MD;  Location: [facility];  Service: Gynecology;  Laterality: N/A;   LUMBAR DISC SURGERY  11/15/2018   @[facility] [place];  L5--S1   VARICOSE VEIN SURGERY  04/2019   laser ablation Right GSV , RLE   Patient Active Problem List   Diagnosis Date Noted   Bunion, left 04/19/2023   Cervical polyp 11/21/2022   Acne 09/24/2022   Hashimoto's thyroiditis 09/01/2022   Abnormal uterine bleeding (AUB) 04/18/2022   Menorrhagia with regular cycle 04/18/2022   S/P lumbar discectomy 11/15/2018   Refusal of blood transfusions as patient is Jehovah's  Witness 11/12/2018    PCP: Dr Clorinda Gershon   REFERRING PROVIDER: Dr Clorinda Gershon   REFERRING DIAG:   THERAPY DIAG:  Pain in left foot  Other abnormalities of gait and mobility  Rationale for Evaluation and Treatment: Rehabilitation  ONSET DATE: DOS: 04/18/2023  SUBJECTIVE:   SUBJECTIVE STATEMENT:  Pt denies pain at entry. Had increased pain with seated heel raise with HEP, stating the pain lingered after performing this exercise as well.  Eval: Patient had a bunionectomy on Left 04/18/2023.   PERTINENT HISTORY: Hashimoto thyroiditis, right bunion. L/S surgery  PAIN:  Are you having pain? Yes: NPRS scale: 0/10 2/10 at worst over the past week  Pain location: left great toe Pain description: aching Aggravating factors: walking; flexion/ extension of the toe  Relieving factors: not moving the toe   PRECAUTIONS: None  RED FLAGS: None   WEIGHT BEARING RESTRICTIONS: No  FALLS:  Has patient fallen in last 6 months? No  LIVING ENVIRONMENT: 1 step up into the house.  OCCUPATION:  Unemployed   Hobbies:  Walking at the park Swimming  Classes at the gym   PLOF: Independent  PATIENT GOALS:  To get back to activity   NEXT MD VISIT:  2 months   OBJECTIVE:  Note: Objective measures were completed at Evaluation unless otherwise noted.  DIAGNOSTIC FINDINGS:  Nothing post op   PATIENT SURVEYS:  LEFS 66/80  COGNITION: Overall cognitive status: Within functional limits for tasks assessed     SENSATION: WFL  EDEMA:  Edema around the first met head     POSTURE: No Significant postural limitations  PALPATION: No significant TTP 2nd to numbness   LOWER EXTREMITY ROM:  Passive ROM Right eval Left eval  Hip flexion    Hip extension    Hip abduction    Hip adduction    Hip internal rotation    Hip external rotation    Knee flexion    Knee extension    Ankle dorsiflexion 17 10  Ankle plantarflexion    Ankle inversion WNL WNL  Ankle  eversion WNL WNL   (Blank rows = not tested) Mild limitations in great toe flexion/extension   LOWER EXTREMITY MMT:  MMT Right eval Left eval  Hip flexion    Hip extension    Hip abduction    Hip adduction    Hip internal rotation    Hip external rotation    Knee flexion    Knee extension    Ankle dorsiflexion    Ankle plantarflexion    Ankle inversion    Ankle eversion     (Blank rows = not tested) to be tested next visit    FUNCTIONAL TESTS:  Single leg stance: mild instability bilateral  GAIT: Supination bilateral with ambualtion, more exaggerated with shoes off    TODAY'S TREATMENT:                                                                                                                              DATE:   07/05/23 PROM L great toe IP joint and MTP joint MT mobilizations 2-5 Ankle 4 way GTB x20ea Seated towel scrunches 2x10 Seated heel raise (to observe form) x5 Seated great toe extension/digits 2-5 extension x10ea Tandem stance L foot posterior x30seconds Terminal stance rocks (L foot posterior) Squat tap to plinth 2x10 HEP update and review    Eval: Reviewed potential shoe ideas  Reviewed self great toe mobilization    Access Code: [email or web address] URL: [email or web address] Date: 06/19/2023 Prepared by: Alm Don  Exercises - Seated Heel Raise ( with graded pressure)   - 1 x daily - 7 x weekly - 3 sets - 10 reps - Seated Ankle Inversion with Resistance and Legs Crossed  - 1 x daily - 7 x weekly - 3 sets - 10 reps - Seated Ankle Eversion with Resistance  - 1 x daily - 7 x weekly - 3 sets - 10 reps - Standing Gastroc Stretch at Counter  - 1 x daily - 7 x weekly - 3 sets - 10 reps - Seated Gastroc Stretch with Strap  - 1 x daily - 7 x weekly - 3 sets - 10 reps - Seated Self Great Toe Stretch  - 1 x daily - 7 x weekly - 3 sets - 10 reps  PATIENT EDUCATION:  Education details: HEP, symptom management, progression  of activity  Person  educated: Patient Education method: Explanation, Demonstration, Tactile cues, Verbal cues, and Handouts Education comprehension: verbalized understanding, returned demonstration, verbal cues required, tactile cues required, and needs further education  HOME EXERCISE PROGRAM: Access Code: [email or web address] URL: [email or web address] Date: 06/19/2023 Prepared by: Alm Don  Exercises - Seated Heel Raise  - 1 x daily - 7 x weekly - 3 sets - 10 reps - Seated Ankle Inversion with Resistance and Legs Crossed  - 1 x daily - 7 x weekly - 3 sets - 10 reps - Seated Ankle Eversion with Resistance  - 1 x daily - 7 x weekly - 3 sets - 10 reps - Standing Gastroc Stretch at Counter  - 1 x daily - 7 x weekly - 3 sets - 10 reps - Seated Gastroc Stretch with Strap  - 1 x daily - 7 x weekly - 3 sets - 10 reps - Seated Self Great Toe Stretch  - 1 x daily - 7 x weekly - 3 sets - 10 reps  ASSESSMENT:  CLINICAL IMPRESSION: Pt has good tolerance for great toe ROM and foot strengthening. She demonstrated good performance with seated great toe extension; however extension of digits 2-5 was challenging. Instructed pt to avoid pain with seated heel raise and can perform in lesser range if tolerable. Updated HEP to include WB tasks and intrinsic foot mm strengthening. Pt to trial aquatic PT next 2 weeks. Will continue to monitor pain level and progress as tolerated.   OBJECTIVE IMPAIRMENTS: Abnormal gait, decreased activity tolerance, decreased endurance, decreased mobility, difficulty walking, decreased ROM, decreased strength, increased edema, and pain.   ACTIVITY LIMITATIONS: carrying, standing, squatting, stairs, transfers, and locomotion level  PARTICIPATION LIMITATIONS: meal prep, cleaning, laundry, shopping, community activity, occupation, yard work, and gym activity  PERSONAL FACTORS: 1-2 comorbidities: right bunion; L/S surgery   are also affecting patient's functional outcome.   REHAB POTENTIAL:  Good  CLINICAL DECISION MAKING: Stable/uncomplicated  EVALUATION COMPLEXITY: Low   GOALS: Goals reviewed with patient? Yes  SHORT TERM GOALS: Target date: 07/17/2023   Patient willd emonstrate full DF  Baseline: Goal status: INITIAL  2.  Patient will demonstrate normal great toe mobility  Baseline:  Goal status: INITIAL  3.  Patient will go be independent with base HEP  Baseline:  Goal status: INITIAL   LONG TERM GOALS: Target date: 08/14/2023    Patient will return to running when she is cleared by MD and has the stability  Baseline:  Goal status: INITIAL  2.  Patient will return to full gym routine  Baseline:  Goal status: INITIAL  3.  Patient will go up/down 8 steps with reciprocal gait pattern  Baseline:  Goal status: INITIAL    PLAN:  PT FREQUENCY: 2x/week  PT DURATION: 8 weeks   PLANNED INTERVENTIONS:  .97110-Therapeutic exercises, 97530- Therapeutic activity, [id number]- Neuromuscular re-education, 97535- Self Care, [id number]- Manual therapy, [id number]- Gait training, [phone]- Aquatic Therapy, 97014- Electrical stimulation (unattended), 97035- Ultrasound, Patient/Family education, Stair training, Taping, Dry Needling,instructions, Cryotherapy, and Moist heat   PLAN FOR NEXT SESSION:  Review HEP. Progress to weight bearing exercises. Consider low march with progression to single leg stance. Progress to air-ex. Continue with manual therapy for the great tow. Consider exercise bike.   Asberry BRAVO Shun Pletz, PTA 07/05/2023, 9:16 AM

## 2023-07-06 ENCOUNTER — Encounter (HOSPITAL_BASED_OUTPATIENT_CLINIC_OR_DEPARTMENT_OTHER): Payer: Self-pay | Admitting: Obstetrics & Gynecology

## 2023-07-06 ENCOUNTER — Ambulatory Visit (HOSPITAL_BASED_OUTPATIENT_CLINIC_OR_DEPARTMENT_OTHER): Payer: Medicaid Other | Admitting: Obstetrics & Gynecology

## 2023-07-06 VITALS — BP 107/54 | HR 64 | Ht 67.0 in | Wt 156.6 lb

## 2023-07-06 DIAGNOSIS — N644 Mastodynia: Secondary | ICD-10-CM

## 2023-07-06 DIAGNOSIS — N6321 Unspecified lump in the left breast, upper outer quadrant: Secondary | ICD-10-CM

## 2023-07-06 NOTE — Progress Notes (Signed)
 GYNECOLOGY  VISIT  CC:   left breast tenderness, mass?  HPI: 43 y.o. [id number] Married White or Caucasian female here for concerning area in her left breast that she noticed about two weeks ago.  The area is tender.  She's had no recent trauma.  She sent a mychart message this week and order for diagnostic imaging placed.  This is scheduled for 1/24.  A little concerned this is three weeks away.  Last mammogram was 01/12/2023 and was normal.  Unrelated, she's had some increased discharge when it is time for her cycle.  Basically no bleeding since her endometrial ablation in may.  Wondering if cycle is coming back.  Will have to monitor to see if this is the case but reassured her that amenorrhea almost immediately after the procedure is a good sign.     Past Medical History:  Diagnosis Date   Hypothyroidism due to Hashimoto thyroiditis    endocridologist--- dr tommas;   labs normalized , no meds   IDA (iron deficiency anemia)    IDA (iron deficiency anemia)    PONV (postoperative nausea and vomiting)    Transfusion of blood product refused for religious reason    Jehovah Witness   Varicose vein of leg    Wears contact lenses     MEDS:   Current Outpatient Medications on File Prior to Visit  Medication Sig Dispense Refill   Ascorbic Acid (VITAMIN C) 1000 MG tablet Take 1,000 mg by mouth daily.     Cholecalciferol (VITAMIN D3) 125 MCG (5000 UT) CAPS Take 1 capsule by mouth daily.     Digestive Enzyme CAPS Take 2 capsules by mouth daily.     Docosahexaenoic Acid (DHA PO) Take 1,000 mg by mouth daily.     Glutathione 500 MG CAPS Take 1 capsule by mouth daily.     ibuprofen  (ADVIL ) 800 MG tablet Take 1 tablet (800 mg total) by mouth every 8 (eight) hours as needed. 30 tablet 0   MAGNESIUM PO Take 150 mg by mouth daily.     Selenium 200 MCG CAPS Take 1 capsule by mouth daily.     Zinc 30 MG TABS Take 1 tablet by mouth daily.     No current facility-administered medications on file prior  to visit.    ALLERGIES: Doxycycline, Tetracycline, Whole blood, and Augmentin [amoxicillin-pot clavulanate]  SH:  married, non smoker  Review of Systems  Constitutional: Negative.   Genitourinary: Negative.     PHYSICAL EXAMINATION:    BP (!) 107/54 (BP Location: Left Arm, Patient Position: Sitting, Cuff Size: Normal)   Pulse 64   Ht 5' 7 (1.702 m)   Wt 156 lb 9.6 oz (71 kg)   BMI 24.53 kg/m     Physical Exam Constitutional:      Appearance: Normal appearance.  Chest:  Breasts:    Right: No inverted nipple, mass, nipple discharge, skin change or tenderness.     Left: Mass and tenderness present. No inverted nipple, nipple discharge or skin change.    Lymphadenopathy:     Cervical: No cervical adenopathy.     Upper Body:     Right upper body: No supraclavicular, axillary or pectoral adenopathy.     Left upper body: No supraclavicular, axillary or pectoral adenopathy.  Neurological:     General: No focal deficit present.     Mental Status: She is alert.  Psychiatric:        Mood and Affect: Mood normal.  Assessment/Plan: 1. Mass of upper outer quadrant of left breast (Primary) - diagnostic mammogram and ultrasound already ordered after my chart message received from patient.  If these are negative and tenderness/findings  remain, would recommend proceeding with MRI at that time.    2. Breast tenderness in female

## 2023-07-10 ENCOUNTER — Ambulatory Visit (HOSPITAL_BASED_OUTPATIENT_CLINIC_OR_DEPARTMENT_OTHER): Payer: Medicaid Other | Admitting: Physical Therapy

## 2023-07-10 ENCOUNTER — Encounter (HOSPITAL_BASED_OUTPATIENT_CLINIC_OR_DEPARTMENT_OTHER): Payer: Self-pay | Admitting: Physical Therapy

## 2023-07-10 DIAGNOSIS — M79672 Pain in left foot: Secondary | ICD-10-CM | POA: Diagnosis not present

## 2023-07-10 DIAGNOSIS — R2689 Other abnormalities of gait and mobility: Secondary | ICD-10-CM

## 2023-07-10 NOTE — Therapy (Signed)
 OUTPATIENT PHYSICAL THERAPY LOWER EXTREMITY TREATMENT   Patient Name: Rebekah Atkins MRN: [id number] DOB:07/29/1980, 43 y.o., female Today's Date: 07/10/2023  END OF SESSION:  PT End of Session - 07/10/23 1502     Visit Number 3    Number of Visits 16    Date for PT Re-Evaluation 08/15/23    PT Start Time 1447    PT Stop Time 1525    PT Time Calculation (min) 38 min    Behavior During Therapy WFL for tasks assessed/performed              Past Medical History:  Diagnosis Date   History of menorrhagia    Hypothyroidism due to Hashimoto thyroiditis    endocridologist--- dr tommas;   labs normalized , no meds   IDA (iron deficiency anemia)    IDA (iron deficiency anemia)    PONV (postoperative nausea and vomiting)    Transfusion of blood product refused for religious reason    Jehovah Witness   Varicose vein of leg    Wears contact lenses    Past Surgical History:  Procedure Laterality Date   BUNIONECTOMY Left 04/18/2023   Procedure: DOUBLE OSTEOTOMY; MASSIE EDWARDS;  Surgeon: Gershon Donnice JONELLE, DPM;  Location: [facility];  Service: Podiatry;  Laterality: Left;  LEG BLOCK   DILITATION & CURRETTAGE/HYSTROSCOPY WITH NOVASURE ABLATION N/A 11/21/2022   Procedure: DILATATION & CURETTAGE/HYSTEROSCOPY WITH MYOSURE; NOVASURE ABLATION;  Surgeon: Cleotilde Ronal RAMAN, MD;  Location: [facility];  Service: Gynecology;  Laterality: N/A;   LUMBAR DISC SURGERY  11/15/2018   @[facility];  L5--S1   VARICOSE VEIN SURGERY  04/2019   laser ablation Right GSV , RLE   Patient Active Problem List   Diagnosis Date Noted   Bunion, left 04/19/2023   Cervical polyp 11/21/2022   Acne 09/24/2022   Hashimoto's thyroiditis 09/01/2022   Abnormal uterine bleeding (AUB) 04/18/2022   S/P lumbar discectomy 11/15/2018   Refusal of blood transfusions as patient is Jehovah's Witness 11/12/2018    PCP: Dr Clorinda Gershon   REFERRING PROVIDER: Dr  Clorinda Gershon   REFERRING DIAG:   THERAPY DIAG:  Pain in left foot  Other abnormalities of gait and mobility  Rationale for Evaluation and Treatment: Rehabilitation  ONSET DATE: DOS: 04/18/2023  SUBJECTIVE:   SUBJECTIVE STATEMENT:  Pt reports that she has been working on her HEP; going well.   Eval: Patient had a bunionectomy on Left 04/18/2023.   PERTINENT HISTORY: Hashimoto thyroiditis, right bunion. L/S surgery  PAIN:  Are you having pain? no: NPRS scale: 0/10   Pain location: left great toe Pain description:  Aggravating factors: walking; flexion/ extension of the toe  Relieving factors: not moving the toe   PRECAUTIONS: None  RED FLAGS: None   WEIGHT BEARING RESTRICTIONS: No  FALLS:  Has patient fallen in last 6 months? No  LIVING ENVIRONMENT: 1 step up into the house.  OCCUPATION:  Unemployed   Hobbies:  Walking at the park Swimming  Classes at the gym   PLOF: Independent  PATIENT GOALS:  To get back to activity   NEXT MD VISIT:  2 months   OBJECTIVE:  Note: Objective measures were completed at Evaluation unless otherwise noted.  DIAGNOSTIC FINDINGS:  Nothing post op   PATIENT SURVEYS:  LEFS 66/80   COGNITION: Overall cognitive status: Within functional limits for tasks assessed     SENSATION: WFL  EDEMA:  Edema around the first met head  POSTURE: No Significant postural limitations  PALPATION: No significant TTP 2nd to numbness   LOWER EXTREMITY ROM:  Passive ROM Right eval Left eval  Hip flexion    Hip extension    Hip abduction    Hip adduction    Hip internal rotation    Hip external rotation    Knee flexion    Knee extension    Ankle dorsiflexion 17 10  Ankle plantarflexion    Ankle inversion WNL WNL  Ankle eversion WNL WNL   (Blank rows = not tested) Mild limitations in great toe flexion/extension   LOWER EXTREMITY MMT:  MMT Right eval Left eval  Hip flexion    Hip extension    Hip abduction     Hip adduction    Hip internal rotation    Hip external rotation    Knee flexion    Knee extension    Ankle dorsiflexion    Ankle plantarflexion    Ankle inversion    Ankle eversion     (Blank rows = not tested) to be tested next land visit    FUNCTIONAL TESTS:  Single leg stance: mild instability bilateral  GAIT: Supination bilateral with ambualtion, more exaggerated with shoes off    TODAY'S TREATMENT:                                                                                                                              DATE:  07/10/23 -Reviewed scar mobilization  Pt seen for aquatic therapy today.  Treatment took place in water 3.5-4.75 ft in depth at the [facility] pool. Temp of water was 91.  Pt entered/exited the pool via stairs independently with bilat rail. - walking forward/ backward unsupported submerged 75% - with cues for heel/ toe and toe/ heel for toe ROM - side stepping R/L  - UE on rainbow hand floats: 3 way LE kick x 10 each LE (some reps slow, some fast) - tandem gait forward/ backward  - UE on wall: heel/ toe raises x 12 - L SLS with hollow blue noodle stomp (10 slow/ 10 fast) ->solid noodle  -> on RLE - bilat feet on yellow noodle (perpendicular) balance - with gentle marching and squats   Pt requires the buoyancy and hydrostatic pressure of water for support, and to offload joints by unweighting joint load by at least 50 % in navel deep water and by at least 75-80% in chest to neck deep water.  Viscosity of the water is needed for resistance of strengthening. Water current perturbations provides challenge to standing balance requiring increased core activation.   07/05/23 PROM L great toe IP joint and MTP joint MT mobilizations 2-5 Ankle 4 way GTB x20ea Seated towel scrunches 2x10 Seated heel raise (to observe form) x5 Seated great toe extension/digits 2-5 extension x10ea Tandem stance L foot posterior x30seconds Terminal stance rocks (L  foot posterior) Squat tap to plinth 2x10 HEP update and review  Eval: Reviewed potential shoe ideas  Reviewed self great toe mobilization    Access Code: [email or web address] URL: [email or web address] Date: 06/19/2023 Prepared by: Alm Don  Exercises - Seated Heel Raise ( with graded pressure)   - 1 x daily - 7 x weekly - 3 sets - 10 reps - Seated Ankle Inversion with Resistance and Legs Crossed  - 1 x daily - 7 x weekly - 3 sets - 10 reps - Seated Ankle Eversion with Resistance  - 1 x daily - 7 x weekly - 3 sets - 10 reps - Standing Gastroc Stretch at Counter  - 1 x daily - 7 x weekly - 3 sets - 10 reps - Seated Gastroc Stretch with Strap  - 1 x daily - 7 x weekly - 3 sets - 10 reps - Seated Self Great Toe Stretch  - 1 x daily - 7 x weekly - 3 sets - 10 reps  PATIENT EDUCATION:  Education details: aquatic therapy exercise; progression of activity  Person educated: Patient Education method: Explanation, Demonstration, Tactile cues, Verbal cues, and Handouts Education comprehension: verbalized understanding, returned demonstration, verbal cues required, tactile cues required, and needs further education  HOME EXERCISE PROGRAM: Access Code: [email or web address] URL: [email or web address] Date: 06/19/2023 Prepared by: Alm Don  Exercises - Seated Heel Raise  - 1 x daily - 7 x weekly - 3 sets - 10 reps - Seated Ankle Inversion with Resistance and Legs Crossed  - 1 x daily - 7 x weekly - 3 sets - 10 reps - Seated Ankle Eversion with Resistance  - 1 x daily - 7 x weekly - 3 sets - 10 reps - Standing Gastroc Stretch at Counter  - 1 x daily - 7 x weekly - 3 sets - 10 reps - Seated Gastroc Stretch with Strap  - 1 x daily - 7 x weekly - 3 sets - 10 reps - Seated Self Great Toe Stretch  - 1 x daily - 7 x weekly - 3 sets - 10 reps  ASSESSMENT:  CLINICAL IMPRESSION: Pt reported some pain in Lt great toe with toe off; encouraged to stay within painfree range and  modify step length accordingly. All other exercises were tolerated well, without pain.  She reported some weakness in LLE with exercises performed in R SLS.   Will continue to monitor pain level and progress as tolerated. Will trial steps next visit.  Will issue aquatic HEP next pool visit if appropriate.     OBJECTIVE IMPAIRMENTS: Abnormal gait, decreased activity tolerance, decreased endurance, decreased mobility, difficulty walking, decreased ROM, decreased strength, increased edema, and pain.   ACTIVITY LIMITATIONS: carrying, standing, squatting, stairs, transfers, and locomotion level  PARTICIPATION LIMITATIONS: meal prep, cleaning, laundry, shopping, community activity, occupation, yard work, and gym activity  PERSONAL FACTORS: 1-2 comorbidities: right bunion; L/S surgery   are also affecting patient's functional outcome.   REHAB POTENTIAL: Good  CLINICAL DECISION MAKING: Stable/uncomplicated  EVALUATION COMPLEXITY: Low   GOALS: Goals reviewed with patient? Yes  SHORT TERM GOALS: Target date: 07/17/2023   Patient will demonstrate full DF  Baseline: Goal status: INITIAL  2.  Patient will demonstrate normal L great toe mobility  Baseline:  Goal status: INITIAL  3.  Patient will go be independent with base HEP  Baseline:  Goal status: INITIAL   LONG TERM GOALS: Target date: 08/14/2023    Patient will return to running when she is cleared by MD and has the stability  Baseline:  Goal status: INITIAL  2.  Patient will return to full gym routine  Baseline:  Goal status: INITIAL  3.  Patient will go up/down 8 steps with reciprocal gait pattern  Baseline:  Goal status: INITIAL    PLAN:  PT FREQUENCY: 2x/week  PT DURATION: 8 weeks   PLANNED INTERVENTIONS:  .[id number]-Therapeutic exercises, 97530- Therapeutic activity, [id number]- Neuromuscular re-education, 97535- Self Care, [id number]- Manual therapy, [id number]- Gait training, [phone]- Aquatic Therapy, 97014- Electrical  stimulation (unattended), 97035- Ultrasound, Patient/Family education, Stair training, Taping, Dry Needling,instructions, Cryotherapy, and Moist heat   PLAN FOR NEXT SESSION:  Review HEP. Progress to weight bearing exercises. Consider low march with progression to single leg stance. Progress to air-ex. Continue with manual therapy for the great tow. Consider exercise bike.   Delon Aquas, PTA 07/10/23 4:58 PM [facility] [address] [place], [place], [address] Phone: [phone]   Fax:  [phone]

## 2023-07-17 ENCOUNTER — Encounter (HOSPITAL_BASED_OUTPATIENT_CLINIC_OR_DEPARTMENT_OTHER): Payer: Self-pay | Admitting: Physical Therapy

## 2023-07-17 ENCOUNTER — Ambulatory Visit (HOSPITAL_BASED_OUTPATIENT_CLINIC_OR_DEPARTMENT_OTHER): Payer: Medicaid Other | Admitting: Physical Therapy

## 2023-07-17 DIAGNOSIS — M79672 Pain in left foot: Secondary | ICD-10-CM | POA: Diagnosis not present

## 2023-07-17 DIAGNOSIS — R2689 Other abnormalities of gait and mobility: Secondary | ICD-10-CM

## 2023-07-17 NOTE — Therapy (Signed)
 OUTPATIENT PHYSICAL THERAPY LOWER EXTREMITY TREATMENT   Patient Name: Rebekah Atkins MRN: [id number] DOB:02/11/1981, 43 y.o., female Today's Date: 07/17/2023  END OF SESSION:  PT End of Session - 07/17/23 1643     Visit Number 4    Number of Visits 16    Date for PT Re-Evaluation 08/15/23    PT Start Time 1447    PT Stop Time 1525    PT Time Calculation (min) 38 min    Behavior During Therapy WFL for tasks assessed/performed               Past Medical History:  Diagnosis Date   History of menorrhagia    Hypothyroidism due to Hashimoto thyroiditis    endocridologist--- dr tommas;   labs normalized , no meds   IDA (iron deficiency anemia)    IDA (iron deficiency anemia)    PONV (postoperative nausea and vomiting)    Transfusion of blood product refused for religious reason    Jehovah Witness   Varicose vein of leg    Wears contact lenses    Past Surgical History:  Procedure Laterality Date   BUNIONECTOMY Left 04/18/2023   Procedure: DOUBLE OSTEOTOMY; MASSIE EDWARDS;  Surgeon: Gershon Donnice JONELLE, DPM;  Location: [facility];  Service: Podiatry;  Laterality: Left;  LEG BLOCK   DILITATION & CURRETTAGE/HYSTROSCOPY WITH NOVASURE ABLATION N/A 11/21/2022   Procedure: DILATATION & CURETTAGE/HYSTEROSCOPY WITH MYOSURE; NOVASURE ABLATION;  Surgeon: Cleotilde Ronal RAMAN, MD;  Location: [facility] [place];  Service: Gynecology;  Laterality: N/A;   LUMBAR DISC SURGERY  11/15/2018   @[email or web address] [place];  L5--S1   VARICOSE VEIN SURGERY  04/2019   laser ablation Right GSV , RLE   Patient Active Problem List   Diagnosis Date Noted   Bunion, left 04/19/2023   Cervical polyp 11/21/2022   Acne 09/24/2022   Hashimoto's thyroiditis 09/01/2022   Abnormal uterine bleeding (AUB) 04/18/2022   S/P lumbar discectomy 11/15/2018   Refusal of blood transfusions as patient is Jehovah's Witness 11/12/2018    PCP: Dr Clorinda Gershon   REFERRING PROVIDER: Dr  Clorinda Gershon   REFERRING DIAG:   THERAPY DIAG:  Pain in left foot  Other abnormalities of gait and mobility  Rationale for Evaluation and Treatment: Rehabilitation  ONSET DATE: DOS: 04/18/2023  SUBJECTIVE:   SUBJECTIVE STATEMENT:  Pt reports that she took a Bar[employer] class last night. It all went well, except the lunges which hurt the toe (up to 6/10) with toe extension.  Pain didn't last - goes away as soon as she returns toe to neutral.  Eval: Patient had a bunionectomy on Left 04/18/2023.   PERTINENT HISTORY: Hashimoto thyroiditis, right bunion. L/S surgery  PAIN:  Are you having pain? yes: NPRS scale: 3/10   Pain location: left great toe Pain description:  Aggravating factors: walking; flexion/ extension of the toe  Relieving factors: not moving the toe   PRECAUTIONS: None  RED FLAGS: None   WEIGHT BEARING RESTRICTIONS: No  FALLS:  Has patient fallen in last 6 months? No  LIVING ENVIRONMENT: 1 step up into the house.  OCCUPATION:  Unemployed   Hobbies:  Walking at the park Swimming  Classes at the gym   PLOF: Independent  PATIENT GOALS:  To get back to activity   NEXT MD VISIT:  2 months   OBJECTIVE:  Note: Objective measures were completed at Evaluation unless otherwise noted.  DIAGNOSTIC FINDINGS:  Nothing post op   PATIENT SURVEYS:  LEFS  66/80   COGNITION: Overall cognitive status: Within functional limits for tasks assessed     SENSATION: WFL  EDEMA:  Edema around the first met head     POSTURE: No Significant postural limitations  PALPATION: No significant TTP 2nd to numbness   LOWER EXTREMITY ROM:  Passive ROM Right eval Left eval  Hip flexion    Hip extension    Hip abduction    Hip adduction    Hip internal rotation    Hip external rotation    Knee flexion    Knee extension    Ankle dorsiflexion 17 10  Ankle plantarflexion    Ankle inversion WNL WNL  Ankle eversion WNL WNL   (Blank rows = not tested) Mild  limitations in great toe flexion/extension   LOWER EXTREMITY MMT:  MMT Right eval Left eval  Hip flexion    Hip extension    Hip abduction    Hip adduction    Hip internal rotation    Hip external rotation    Knee flexion    Knee extension    Ankle dorsiflexion    Ankle plantarflexion    Ankle inversion    Ankle eversion     (Blank rows = not tested) to be tested next land visit    FUNCTIONAL TESTS:  Single leg stance: mild instability bilateral  GAIT: Supination bilateral with ambualtion, more exaggerated with shoes off    TODAY'S TREATMENT:                                                                                                                              DATE:  07/17/23 -reviewed self PROM for L great toe Pt seen for aquatic therapy today.  Treatment took place in water 3.5-4.75 ft in depth at the [facility] pool. Temp of water was 91.  Pt entered/exited the pool via stairs independently with bilat rail. - walking forward/ backward unsupported submerged 75% - with cues for heel/ toe and toe/ heel for toe ROM - side stepping R/L with arm addct with rainbow hand floats, cues to increase speed - UE on rainbow hand floats: 3 way LE kick x 10 each LE; leg swings into hip abdct/ addct x 10 each; heel raises x 20 ( 75% submerged)  - trial of split squats with UE support- not tolerated -> hip ext/toe ext as wind up and kick forward with PF - UE on wall: heel/ toe raises x 12 - SLS with solid noodle stomp (10 slow/ 10 fast) ->thich yellow noodle x 10 each  - bilat feet on yellow noodle (perpendicular) balance - with gentle marching and squats-> tandem stance on yellow noodle - light jogging forward/ backward in 4+ ft of water -no pain   07/10/23 -Reviewed scar mobilization  Pt seen for aquatic therapy today.  Treatment took place in water 3.5-4.75 ft in depth at the [facility] pool. Temp of water was 91.  Pt entered/exited the  pool via stairs  independently with bilat rail. - walking forward/ backward unsupported submerged 75% - with cues for heel/ toe and toe/ heel for toe ROM - side stepping R/L  - UE on rainbow hand floats: 3 way LE kick x 10 each LE (some reps slow, some fast) - tandem gait forward/ backward  - UE on wall: heel/ toe raises x 12 - L SLS with hollow blue noodle stomp (10 slow/ 10 fast) ->solid noodle  -> on RLE - bilat feet on yellow noodle (perpendicular) balance - with gentle marching and squats   07/05/23 PROM L great toe IP joint and MTP joint MT mobilizations 2-5 Ankle 4 way GTB x20ea Seated towel scrunches 2x10 Seated heel raise (to observe form) x5 Seated great toe extension/digits 2-5 extension x10ea Tandem stance L foot posterior x30seconds Terminal stance rocks (L foot posterior) Squat tap to plinth 2x10 HEP update and review    Eval: Reviewed potential shoe ideas  Reviewed self great toe mobilization    Access Code: [email or web address] URL: [email or web address] Date: 06/19/2023 Prepared by: Alm Don  Exercises - Seated Heel Raise ( with graded pressure)   - 1 x daily - 7 x weekly - 3 sets - 10 reps - Seated Ankle Inversion with Resistance and Legs Crossed  - 1 x daily - 7 x weekly - 3 sets - 10 reps - Seated Ankle Eversion with Resistance  - 1 x daily - 7 x weekly - 3 sets - 10 reps - Standing Gastroc Stretch at Counter  - 1 x daily - 7 x weekly - 3 sets - 10 reps - Seated Gastroc Stretch with Strap  - 1 x daily - 7 x weekly - 3 sets - 10 reps - Seated Self Great Toe Stretch  - 1 x daily - 7 x weekly - 3 sets - 10 reps  PATIENT EDUCATION:  Education details: aquatic therapy exercise; progression of activity  Person educated: Patient Education method: Explanation, Demonstration, Tactile cues, Verbal cues, and Handouts Education comprehension: verbalized understanding, returned demonstration, verbal cues required, tactile cues required, and needs further education  HOME  EXERCISE PROGRAM: Access Code: [email or web address] URL: [email or web address] Date: 06/19/2023 Prepared by: Alm Don  Exercises - Seated Heel Raise  - 1 x daily - 7 x weekly - 3 sets - 10 reps - Seated Ankle Inversion with Resistance and Legs Crossed  - 1 x daily - 7 x weekly - 3 sets - 10 reps - Seated Ankle Eversion with Resistance  - 1 x daily - 7 x weekly - 3 sets - 10 reps - Standing Gastroc Stretch at Counter  - 1 x daily - 7 x weekly - 3 sets - 10 reps - Seated Gastroc Stretch with Strap  - 1 x daily - 7 x weekly - 3 sets - 10 reps - Seated Self Great Toe Stretch  - 1 x daily - 7 x weekly - 3 sets - 10 reps  ASSESSMENT:  CLINICAL IMPRESSION: Pt reported some pain in Lt great toe with toe off and when lunging up to 4/10 in water; encouraged to stay within painfree range and modify step length accordingly. All other exercises were tolerated well, without pain. Improved ability for balance in R/L SLS.   Will continue to monitor pain level and progress as tolerated.  Progressing towards goals.  Therapist to ck STG next visit.     OBJECTIVE IMPAIRMENTS: Abnormal gait, decreased activity tolerance, decreased endurance, decreased mobility, difficulty walking, decreased ROM, decreased strength,  increased edema, and pain.   ACTIVITY LIMITATIONS: carrying, standing, squatting, stairs, transfers, and locomotion level  PARTICIPATION LIMITATIONS: meal prep, cleaning, laundry, shopping, community activity, occupation, yard work, and gym activity  PERSONAL FACTORS: 1-2 comorbidities: right bunion; L/S surgery   are also affecting patient's functional outcome.   REHAB POTENTIAL: Good  CLINICAL DECISION MAKING: Stable/uncomplicated  EVALUATION COMPLEXITY: Low   GOALS: Goals reviewed with patient? Yes  SHORT TERM GOALS: Target date: 07/17/2023   Patient will demonstrate full DF  Baseline: Goal status: INITIAL  2.  Patient will demonstrate normal L great toe mobility   Baseline:  Goal status: INITIAL  3.  Patient will go be independent with base HEP  Baseline:  Goal status: INITIAL   LONG TERM GOALS: Target date: 08/14/2023    Patient will return to running when she is cleared by MD and has the stability  Baseline:  Goal status: INITIAL  2.  Patient will return to full gym routine  Baseline:  Goal status: INITIAL  3.  Patient will go up/down 8 steps with reciprocal gait pattern  Baseline:  Goal status: INITIAL    PLAN:  PT FREQUENCY: 2x/week  PT DURATION: 8 weeks   PLANNED INTERVENTIONS:  .97110-Therapeutic exercises, 97530- Therapeutic activity, [id number]- Neuromuscular re-education, 97535- Self Care, 02859- Manual therapy, [id number]- Gait training, [id number]- Aquatic Therapy, 97014- Electrical stimulation (unattended), 97035- Ultrasound, Patient/Family education, Stair training, Taping, Dry Needling,instructions, Cryotherapy, and Moist heat   PLAN FOR NEXT SESSION:  Review HEP. Progress to weight bearing exercises. Consider low march with progression to single leg stance. Progress to air-ex. Continue with manual therapy for the great tow. Consider exercise bike.   Delon Aquas, PTA 07/17/23 4:45 PM [facility] [address] [place], [place], [address] Phone: [phone]   Fax:  [phone]

## 2023-07-23 ENCOUNTER — Encounter (HOSPITAL_BASED_OUTPATIENT_CLINIC_OR_DEPARTMENT_OTHER): Payer: Self-pay

## 2023-07-23 ENCOUNTER — Ambulatory Visit (HOSPITAL_BASED_OUTPATIENT_CLINIC_OR_DEPARTMENT_OTHER): Payer: Medicaid Other

## 2023-07-23 DIAGNOSIS — M79672 Pain in left foot: Secondary | ICD-10-CM

## 2023-07-23 DIAGNOSIS — R2689 Other abnormalities of gait and mobility: Secondary | ICD-10-CM

## 2023-07-23 NOTE — Therapy (Signed)
OUTPATIENT PHYSICAL THERAPY LOWER EXTREMITY TREATMENT   Patient Name: Rebekah Atkins MRN: [id number] DOB:10/13/1980, 43 y.o., female Today's Date: 07/23/2023  END OF SESSION:  PT End of Session - 07/23/23 1347     Visit Number 5    Number of Visits 16    Date for PT Re-Evaluation 08/15/23    PT Start Time 1347    PT Stop Time 1430    PT Time Calculation (min) 43 min    Activity Tolerance Patient tolerated treatment well    Behavior During Therapy WFL for tasks assessed/performed                Past Medical History:  Diagnosis Date   History of menorrhagia    Hypothyroidism due to Hashimoto thyroiditis    endocridologist--- dr Rebekah Atkins;   labs normalized , no meds   IDA (iron deficiency anemia)    IDA (iron deficiency anemia)    PONV (postoperative nausea and vomiting)    Transfusion of blood product refused for religious reason    Jehovah Witness   Varicose vein of leg    Wears contact lenses    Past Surgical History:  Procedure Laterality Date   BUNIONECTOMY Left 04/18/2023   Procedure: DOUBLE OSTEOTOMY; Rebekah Atkins;  Surgeon: Rebekah Atkins, DPM;  Location: [facility];  Service: Podiatry;  Laterality: Left;  LEG BLOCK   DILITATION & CURRETTAGE/HYSTROSCOPY WITH NOVASURE ABLATION N/A 11/21/2022   Procedure: DILATATION & CURETTAGE/HYSTEROSCOPY WITH MYOSURE; NOVASURE ABLATION;  Surgeon: Rebekah Bears, MD;  Location: [facility];  Service: Gynecology;  Laterality: N/A;   LUMBAR DISC SURGERY  11/15/2018   @[email or web address] --Rebekah Atkins;  L5--S1   VARICOSE VEIN SURGERY  04/2019   laser ablation Right GSV , RLE   Patient Active Problem List   Diagnosis Date Noted   Bunion, left 04/19/2023   Cervical polyp 11/21/2022   Acne 09/24/2022   Hashimoto's thyroiditis 09/01/2022   Abnormal uterine bleeding (AUB) 04/18/2022   S/P lumbar discectomy 11/15/2018   Refusal of blood transfusions as patient is Jehovah's Witness 11/12/2018     PCP: Dr Rebekah Atkins   REFERRING PROVIDER: Dr Rebekah Atkins   REFERRING DIAG:   THERAPY DIAG:  Pain in left foot  Other abnormalities of gait and mobility  Rationale for Evaluation and Treatment: Rehabilitation  ONSET DATE: DOS: 04/18/2023  SUBJECTIVE:   SUBJECTIVE STATEMENT:  Pt reports she went to [employer] and is trying to decide on a pair of shoes. Walking has been going well.   Eval: Patient had a bunionectomy on Left 04/18/2023.   PERTINENT HISTORY: Hashimoto thyroiditis, right bunion. L/S surgery  PAIN:  Are you having pain? yes: NPRS scale: 3/10   Pain location: left great toe Pain description:  Aggravating factors: walking; flexion/ extension of the toe  Relieving factors: not moving the toe   PRECAUTIONS: None  RED FLAGS: None   WEIGHT BEARING RESTRICTIONS: No  FALLS:  Has patient fallen in last 6 months? No  LIVING ENVIRONMENT: 1 step up into the house.  OCCUPATION:  Unemployed   Hobbies:  Walking at the park Swimming  Classes at the gym   PLOF: Independent  PATIENT GOALS:  To get back to activity   NEXT MD VISIT:  2 months   OBJECTIVE:  Note: Objective measures were completed at Evaluation unless otherwise noted.  DIAGNOSTIC FINDINGS:  Nothing post op   PATIENT SURVEYS:  LEFS 66/80   COGNITION: Overall cognitive status: Within functional  limits for tasks assessed     SENSATION: WFL  EDEMA:  Edema around the first met head     POSTURE: No Significant postural limitations  PALPATION: No significant TTP 2nd to numbness   LOWER EXTREMITY ROM:  Passive ROM Right eval Left eval Right 1/20 Left 1/20  Hip flexion      Hip extension      Hip abduction      Hip adduction      Hip internal rotation      Hip external rotation      Knee flexion      Knee extension      Ankle dorsiflexion 17 10 10active 19deg passive 10active  18deg passive  Ankle plantarflexion      Ankle inversion WNL WNL    Ankle  eversion WNL WNL     (Blank rows = not tested) Mild limitations in great toe flexion/extension  1/20: mild limitation in great toe extension  LOWER EXTREMITY MMT:  MMT Right eval Left eval  Hip flexion    Hip extension    Hip abduction    Hip adduction    Hip internal rotation    Hip external rotation    Knee flexion    Knee extension    Ankle dorsiflexion    Ankle plantarflexion    Ankle inversion    Ankle eversion     (Blank rows = not tested) to be tested next land visit    FUNCTIONAL TESTS:  Single leg stance: mild instability bilateral  GAIT: Supination bilateral with ambualtion, more exaggerated with shoes off    TODAY'S TREATMENT:                                                                                                                              DATE:    07/23/23 PROM L great toe IP joint and MTP joint MT mobilizations 2-5 Seated towel scrunches x30 Seated heel raise x10 Seated great toe extension/digits 2-5 extension x10ea Tandem stance L foot posterior x30seconds Partial lunge rocks x10 (pain free range) Squat tap to plinth 2x10 (slightly staggered with L LE posterior) Step ups 6" x20 Tandem stance on balance beam 30sec x2 Tandem walking on balance beam x2 laps Standing heel raise 2x10  07/17/23 -reviewed self PROM for L great toe Pt seen for aquatic therapy today.  Treatment took place in water 3.5-4.75 ft in depth at the [facility] pool. Temp of water was 91.  Pt entered/exited the pool via stairs independently with bilat rail. - walking forward/ backward unsupported submerged 75% - with cues for heel/ toe and toe/ heel for toe ROM - side stepping R/L with arm addct with rainbow hand floats, cues to increase speed - UE on rainbow hand floats: 3 way LE kick x 10 each LE; leg swings into hip abdct/ addct x 10 each; heel raises x 20 ( 75% submerged)  - trial of split squats with UE support-  not tolerated -> hip ext/toe ext as wind up and  kick forward with PF - UE on wall: heel/ toe raises x 12 - SLS with solid noodle stomp (10 slow/ 10 fast) ->thich yellow noodle x 10 each  - bilat feet on yellow noodle (perpendicular) balance - with gentle marching and squats-> tandem stance on yellow noodle - light jogging forward/ backward in 4+ ft of water -no pain   07/10/23 -Reviewed scar mobilization  Pt seen for aquatic therapy today.  Treatment took place in water 3.5-4.75 ft in depth at the [facility] pool. Temp of water was 91.  Pt entered/exited the pool via stairs independently with bilat rail. - walking forward/ backward unsupported submerged 75% - with cues for heel/ toe and toe/ heel for toe ROM - side stepping R/L  - UE on rainbow hand floats: 3 way LE kick x 10 each LE (some reps slow, some fast) - tandem gait forward/ backward  - UE on wall: heel/ toe raises x 12 - L SLS with hollow blue noodle stomp (10 slow/ 10 fast) ->solid noodle  -> on RLE - bilat feet on yellow noodle (perpendicular) balance - with gentle marching and squats   07/05/23 PROM L great toe IP joint and MTP joint MT mobilizations 2-5 Ankle 4 way GTB x20ea Seated towel scrunches 2x10 Seated heel raise (to observe form) x5 Seated great toe extension/digits 2-5 extension x10ea Tandem stance L foot posterior x30seconds Terminal stance rocks (L foot posterior) Squat tap to plinth 2x10 HEP update and review    Eval: Reviewed potential shoe ideas  Reviewed self great toe mobilization    Access Code: [email or web address] URL: [email or web address] Date: 06/19/2023 Prepared by: Lorayne Bender  Exercises - Seated Heel Raise ( with graded pressure)   - 1 x daily - 7 x weekly - 3 sets - 10 reps - Seated Ankle Inversion with Resistance and Legs Crossed  - 1 x daily - 7 x weekly - 3 sets - 10 reps - Seated Ankle Eversion with Resistance  - 1 x daily - 7 x weekly - 3 sets - 10 reps - Standing Gastroc Stretch at Counter  - 1 x daily - 7 x  weekly - 3 sets - 10 reps - Seated Gastroc Stretch with Strap  - 1 x daily - 7 x weekly - 3 sets - 10 reps - Seated Self Great Toe Stretch  - 1 x daily - 7 x weekly - 3 sets - 10 reps  PATIENT EDUCATION:  Education details: aquatic therapy exercise; progression of activity  Person educated: Patient Education method: Explanation, Demonstration, Tactile cues, Verbal cues, and Handouts Education comprehension: verbalized understanding, returned demonstration, verbal cues required, tactile cues required, and needs further education  HOME EXERCISE PROGRAM: Access Code: [email or web address] URL: [email or web address] Date: 06/19/2023 Prepared by: Lorayne Bender  Exercises - Seated Heel Raise  - 1 x daily - 7 x weekly - 3 sets - 10 reps - Seated Ankle Inversion with Resistance and Legs Crossed  - 1 x daily - 7 x weekly - 3 sets - 10 reps - Seated Ankle Eversion with Resistance  - 1 x daily - 7 x weekly - 3 sets - 10 reps - Standing Gastroc Stretch at Counter  - 1 x daily - 7 x weekly - 3 sets - 10 reps - Seated Gastroc Stretch with Strap  - 1 x daily - 7 x weekly - 3 sets - 10 reps - Seated Self Great Toe Stretch  -  1 x daily - 7 x weekly - 3 sets - 10 reps  ASSESSMENT:  CLINICAL IMPRESSION: Progressed to heel raises in standing position without onset of pain. She continues to demonstrate excellent ROM in L foot and ankle. Updated ankle DF measurements with negligible difference between L and R. Some great toe extension stiffness, though flexion appears WNL. Will continue to work on improving push off mechanism while avoiding painful ranges. Advised pt gently progress with exercise program and walking distance.    OBJECTIVE IMPAIRMENTS: Abnormal gait, decreased activity tolerance, decreased endurance, decreased mobility, difficulty walking, decreased ROM, decreased strength, increased edema, and pain.   ACTIVITY LIMITATIONS: carrying, standing, squatting, stairs, transfers, and locomotion  level  PARTICIPATION LIMITATIONS: meal prep, cleaning, laundry, shopping, community activity, occupation, yard work, and gym activity  PERSONAL FACTORS: 1-2 comorbidities: right bunion; L/S surgery   are also affecting patient's functional outcome.   REHAB POTENTIAL: Good  CLINICAL DECISION MAKING: Stable/uncomplicated  EVALUATION COMPLEXITY: Low   GOALS: Goals reviewed with patient? Yes  SHORT TERM GOALS: Target date: 07/17/2023   Patient will demonstrate full DF  Baseline: Goal status: MET   2.  Patient will demonstrate normal L great toe mobility  Baseline:  Goal status: IN PROGRESS 1/20  3.  Patient will go be independent with base HEP  Baseline:  Goal status: MET 1/20   LONG TERM GOALS: Target date: 08/14/2023    Patient will return to running when she is cleared by MD and has the stability  Baseline:  Goal status: INITIAL  2.  Patient will return to full gym routine  Baseline:  Goal status: IN PROGRESS  3.  Patient will go up/down 8 steps with reciprocal gait pattern  Baseline:  Goal status: INITIAL    PLAN:  PT FREQUENCY: 2x/week  PT DURATION: 8 weeks   PLANNED INTERVENTIONS:  .[id number]-Therapeutic exercises, 97530- Therapeutic activity, [id number]- Neuromuscular re-education, 97535- Self Care, [id number]- Manual therapy, [id number]- Gait training, [phone]- Aquatic Therapy, [id number]- Electrical stimulation (unattended), 97035- Ultrasound, Patient/Family education, Stair training, Taping, Dry Needling,instructions, Cryotherapy, and Moist heat   PLAN FOR NEXT SESSION:  Review HEP. Progress to weight bearing exercises. Consider low march with progression to single leg stance. Progress to air-ex. Continue with manual therapy for the great tow. Consider exercise bike.   Riki Altes, PTA  07/23/23 3:51 PM [facility] [address] [place], [place], [address] Phone: [phone]   Fax:  [phone]

## 2023-07-27 ENCOUNTER — Other Ambulatory Visit: Payer: Medicaid Other

## 2023-08-06 ENCOUNTER — Ambulatory Visit: Payer: Medicaid Other | Admitting: Podiatry

## 2023-08-07 ENCOUNTER — Ambulatory Visit (HOSPITAL_BASED_OUTPATIENT_CLINIC_OR_DEPARTMENT_OTHER): Payer: Medicaid Other | Attending: Podiatry | Admitting: Physical Therapy

## 2023-08-07 ENCOUNTER — Encounter (HOSPITAL_BASED_OUTPATIENT_CLINIC_OR_DEPARTMENT_OTHER): Payer: Self-pay | Admitting: Physical Therapy

## 2023-08-07 DIAGNOSIS — R2689 Other abnormalities of gait and mobility: Secondary | ICD-10-CM | POA: Insufficient documentation

## 2023-08-07 DIAGNOSIS — M79672 Pain in left foot: Secondary | ICD-10-CM | POA: Diagnosis present

## 2023-08-07 NOTE — Therapy (Signed)
 OUTPATIENT PHYSICAL THERAPY LOWER EXTREMITY TREATMENT   Patient Name: Rebekah Atkins MRN: [id number] DOB:26-Oct-1980, 43 y.o., female Today's Date: 08/07/2023  END OF SESSION:       Past Medical History:  Diagnosis Date   History of menorrhagia    Hypothyroidism due to Hashimoto thyroiditis    endocridologist--- dr tommas;   labs normalized , no meds   IDA (iron deficiency anemia)    IDA (iron deficiency anemia)    PONV (postoperative nausea and vomiting)    Transfusion of blood product refused for religious reason    Jehovah Witness   Varicose vein of leg    Wears contact lenses    Past Surgical History:  Procedure Laterality Date   BUNIONECTOMY Left 04/18/2023   Procedure: DOUBLE OSTEOTOMY; MASSIE EDWARDS;  Surgeon: Gershon Donnice JONELLE, DPM;  Location: [facility];  Service: Podiatry;  Laterality: Left;  LEG BLOCK   DILITATION & CURRETTAGE/HYSTROSCOPY WITH NOVASURE ABLATION N/A 11/21/2022   Procedure: DILATATION & CURETTAGE/HYSTEROSCOPY WITH MYOSURE; NOVASURE ABLATION;  Surgeon: Cleotilde Ronal RAMAN, MD;  Location: [facility];  Service: Gynecology;  Laterality: N/A;   LUMBAR DISC SURGERY  11/15/2018   @[email or web address] --[place], [place];  L5--S1   VARICOSE VEIN SURGERY  04/2019   laser ablation Right GSV , RLE   Patient Active Problem List   Diagnosis Date Noted   Bunion, left 04/19/2023   Cervical polyp 11/21/2022   Acne 09/24/2022   Hashimoto's thyroiditis 09/01/2022   Abnormal uterine bleeding (AUB) 04/18/2022   S/P lumbar discectomy 11/15/2018   Refusal of blood transfusions as patient is Jehovah's Witness 11/12/2018    PCP: Dr Clorinda Gershon   REFERRING PROVIDER: Dr Clorinda Gershon   REFERRING DIAG:   THERAPY DIAG:  No diagnosis found.  Rationale for Evaluation and Treatment: Rehabilitation  ONSET DATE: DOS: 04/18/2023  SUBJECTIVE:   SUBJECTIVE STATEMENT:  The patient has made a conscious effort to put more weight onto  through her big toe rather than supinating her foot and putting more weight on her aspect.  She reports since she started doing that she has had more pain in her foot in the area that she had the surgery. Eval: Patient had a bunionectomy on Left 04/18/2023.   PERTINENT HISTORY: Hashimoto thyroiditis, right bunion. L/S surgery  PAIN:  Are you having pain? yes: NPRS scale: 3/10   Pain location: left great toe Pain description:  Aggravating factors: walking; flexion/ extension of the toe  Relieving factors: not moving the toe   PRECAUTIONS: None  RED FLAGS: None   WEIGHT BEARING RESTRICTIONS: No  FALLS:  Has patient fallen in last 6 months? No  LIVING ENVIRONMENT: 1 step up into the house.  OCCUPATION:  Unemployed   Hobbies:  Walking at the park Swimming  Classes at the gym   PLOF: Independent  PATIENT GOALS:  To get back to activity   NEXT MD VISIT:  2 months   OBJECTIVE:  Note: Objective measures were completed at Evaluation unless otherwise noted.  DIAGNOSTIC FINDINGS:  Nothing post op   PATIENT SURVEYS:  LEFS 66/80   COGNITION: Overall cognitive status: Within functional limits for tasks assessed     SENSATION: WFL  EDEMA:  Edema around the first met head     POSTURE: No Significant postural limitations  PALPATION: No significant TTP 2nd to numbness   LOWER EXTREMITY ROM:  Passive ROM Right eval Left eval Right 1/20 Left 1/20  Hip flexion      Hip  extension      Hip abduction      Hip adduction      Hip internal rotation      Hip external rotation      Knee flexion      Knee extension      Ankle dorsiflexion 17 10 10active 19deg passive 10active  18deg passive  Ankle plantarflexion      Ankle inversion WNL WNL    Ankle eversion WNL WNL     (Blank rows = not tested) Mild limitations in great toe flexion/extension  1/20: mild limitation in great toe extension  LOWER EXTREMITY MMT:  MMT Right eval Left eval  Hip flexion     Hip extension    Hip abduction    Hip adduction    Hip internal rotation    Hip external rotation    Knee flexion    Knee extension    Ankle dorsiflexion    Ankle plantarflexion    Ankle inversion    Ankle eversion     (Blank rows = not tested) to be tested next land visit    FUNCTIONAL TESTS:  Single leg stance: mild instability bilateral  GAIT: Supination bilateral with ambualtion, more exaggerated with shoes off    TODAY'S TREATMENT:                                                                                                                              DATE:   2/4 Manual: Great toe mobilization; gastroc stretch and trigger point release  There-ex: Ankle DF 2x20 green  Anfke eversion 2x20  Heel/toe raise   There-Act Step and hold 4 inch  Self Care  Nuro-Re-ed  Step onto air-ex fw and lateral 2x15 each  Heelytoe raise on air-ex  Step and hold 4 inch 2x10    07/23/23 PROM L great toe IP joint and MTP joint MT mobilizations 2-5 Seated towel scrunches x30 Seated heel raise x10 Seated great toe extension/digits 2-5 extension x10ea Tandem stance L foot posterior x30seconds Partial lunge rocks x10 (pain free range) Squat tap to plinth 2x10 (slightly staggered with L LE posterior) Step ups 6 x20 Tandem stance on balance beam 30sec x2 Tandem walking on balance beam x2 laps Standing heel raise 2x10  07/17/23 -reviewed self PROM for L great toe Pt seen for aquatic therapy today.  Treatment took place in water 3.5-4.75 ft in depth at the [facility] pool. Temp of water was 91.  Pt entered/exited the pool via stairs independently with bilat rail. - walking forward/ backward unsupported submerged 75% - with cues for heel/ toe and toe/ heel for toe ROM - side stepping R/L with arm addct with rainbow hand floats, cues to increase speed - UE on rainbow hand floats: 3 way LE kick x 10 each LE; leg swings into hip abdct/ addct x 10 each; heel raises x 20 (  75% submerged)  - trial of split squats with UE support-  not tolerated -> hip ext/toe ext as wind up and kick forward with PF - UE on wall: heel/ toe raises x 12 - SLS with solid noodle stomp (10 slow/ 10 fast) ->thich yellow noodle x 10 each  - bilat feet on yellow noodle (perpendicular) balance - with gentle marching and squats-> tandem stance on yellow noodle - light jogging forward/ backward in 4+ ft of water -no pain   07/10/23 -Reviewed scar mobilization  Pt seen for aquatic therapy today.  Treatment took place in water 3.5-4.75 ft in depth at the [facility] pool. Temp of water was 91.  Pt entered/exited the pool via stairs independently with bilat rail. - walking forward/ backward unsupported submerged 75% - with cues for heel/ toe and toe/ heel for toe ROM - side stepping R/L  - UE on rainbow hand floats: 3 way LE kick x 10 each LE (some reps slow, some fast) - tandem gait forward/ backward  - UE on wall: heel/ toe raises x 12 - L SLS with hollow blue noodle stomp (10 slow/ 10 fast) ->solid noodle  -> on RLE - bilat feet on yellow noodle (perpendicular) balance - with gentle marching and squats   07/05/23 PROM L great toe IP joint and MTP joint MT mobilizations 2-5 Ankle 4 way GTB x20ea Seated towel scrunches 2x10 Seated heel raise (to observe form) x5 Seated great toe extension/digits 2-5 extension x10ea Tandem stance L foot posterior x30seconds Terminal stance rocks (L foot posterior) Squat tap to plinth 2x10 HEP update and review    Eval: Reviewed potential shoe ideas  Reviewed self great toe mobilization    Access Code: [email or web address] URL: [email or web address] Date: 06/19/2023 Prepared by: Alm Don  Exercises - Seated Heel Raise ( with graded pressure)   - 1 x daily - 7 x weekly - 3 sets - 10 reps - Seated Ankle Inversion with Resistance and Legs Crossed  - 1 x daily - 7 x weekly - 3 sets - 10 reps - Seated Ankle Eversion with Resistance   - 1 x daily - 7 x weekly - 3 sets - 10 reps - Standing Gastroc Stretch at Counter  - 1 x daily - 7 x weekly - 3 sets - 10 reps - Seated Gastroc Stretch with Strap  - 1 x daily - 7 x weekly - 3 sets - 10 reps - Seated Self Great Toe Stretch  - 1 x daily - 7 x weekly - 3 sets - 10 reps  PATIENT EDUCATION:  Education details: aquatic therapy exercise; progression of activity  Person educated: Patient Education method: Explanation, Demonstration, Tactile cues, Verbal cues, and Handouts Education comprehension: verbalized understanding, returned demonstration, verbal cues required, tactile cues required, and needs further education  HOME EXERCISE PROGRAM: Access Code: [email or web address] URL: [email or web address] Date: 06/19/2023 Prepared by: Alm Don  Exercises - Seated Heel Raise  - 1 x daily - 7 x weekly - 3 sets - 10 reps - Seated Ankle Inversion with Resistance and Legs Crossed  - 1 x daily - 7 x weekly - 3 sets - 10 reps - Seated Ankle Eversion with Resistance  - 1 x daily - 7 x weekly - 3 sets - 10 reps - Standing Gastroc Stretch at Counter  - 1 x daily - 7 x weekly - 3 sets - 10 reps - Seated Gastroc Stretch with Strap  - 1 x daily - 7 x weekly - 3 sets - 10 reps - Seated Self Great Toe Stretch  -  1 x daily - 7 x weekly - 3 sets - 10 reps  ASSESSMENT:  CLINICAL IMPRESSION: Patient is concerned with the area of the pain.  The pain is where her surgical site was.  It is more likely that is just still sensitive.  We will monitor the pain over the next week.  Hopefully as she puts more pressure into it it becomes more desensitized to pressure.  She has very good range of motion in her toe and ankle.  She has no significant tenderness to palpation.   OBJECTIVE IMPAIRMENTS: Abnormal gait, decreased activity tolerance, decreased endurance, decreased mobility, difficulty walking, decreased ROM, decreased strength, increased edema, and pain.   ACTIVITY LIMITATIONS: carrying,  standing, squatting, stairs, transfers, and locomotion level  PARTICIPATION LIMITATIONS: meal prep, cleaning, laundry, shopping, community activity, occupation, yard work, and gym activity  PERSONAL FACTORS: 1-2 comorbidities: right bunion; L/S surgery   are also affecting patient's functional outcome.   REHAB POTENTIAL: Good  CLINICAL DECISION MAKING: Stable/uncomplicated  EVALUATION COMPLEXITY: Low   GOALS: Goals reviewed with patient? Yes  SHORT TERM GOALS: Target date: 09/05/2023     Patient will demonstrate full DF  Baseline: Goal status: MET   2.  Patient will demonstrate normal L great toe mobility  Baseline:  Goal status: WNL today 2/4  3.  Patient will go be independent with base HEP  Baseline:  Goal status: MET 1/20   LONG TERM GOALS: Target date: 08/14/2023    Patient will return to running when she is cleared by MD and has the stability  Baseline:  Goal status: INITIAL  2.  Patient will return to full gym routine  Baseline:  Goal status: IN PROGRESS  3.  Patient will go up/down 8 steps with reciprocal gait pattern  Baseline:  Goal status: INITIAL    PLAN:  PT FREQUENCY: 2x/week  PT DURATION: 8 weeks   PLANNED INTERVENTIONS:  .97110-Therapeutic exercises, 97530- Therapeutic activity, [id number]- Neuromuscular re-education, 97535- Self Care, [id number]- Manual therapy, [id number]- Gait training, [phone]- Aquatic Therapy, 97014- Electrical stimulation (unattended), 97035- Ultrasound, Patient/Family education, Stair training, Taping, Dry Needling,instructions, Cryotherapy, and Moist heat   PLAN FOR NEXT SESSION:  Review HEP. Progress to weight bearing exercises. Consider low march with progression to single leg stance. Progress to air-ex. Continue with manual therapy for the great tow. Consider exercise bike.   Alm Don PT DPT  08/07/23 2:56 PM [facility] [address] [place], [place], [address] Phone:  [phone]   Fax:  [phone]
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
## 2023-08-14 ENCOUNTER — Encounter (HOSPITAL_BASED_OUTPATIENT_CLINIC_OR_DEPARTMENT_OTHER): Payer: Self-pay | Admitting: Physical Therapy

## 2023-08-14 ENCOUNTER — Ambulatory Visit (HOSPITAL_BASED_OUTPATIENT_CLINIC_OR_DEPARTMENT_OTHER): Payer: Medicaid Other | Admitting: Physical Therapy

## 2023-08-14 DIAGNOSIS — R2689 Other abnormalities of gait and mobility: Secondary | ICD-10-CM

## 2023-08-14 DIAGNOSIS — M79672 Pain in left foot: Secondary | ICD-10-CM

## 2023-08-14 NOTE — Therapy (Addendum)
OUTPATIENT PHYSICAL THERAPY LOWER EXTREMITY TREATMENT   Patient Name: Rebekah Atkins MRN: [id number] DOB:08/03/1980, 43 y.o., female Today's Date: 08/14/2023  END OF SESSION:  PT End of Session - 08/14/23 1527     Visit Number 7    Number of Visits 16    Date for PT Re-Evaluation 08/15/23    PT Start Time 1430    PT Stop Time 1512    PT Time Calculation (min) 42 min    Activity Tolerance Patient tolerated treatment well    Behavior During Therapy WFL for tasks assessed/performed                 Past Medical History:  Diagnosis Date   History of menorrhagia    Hypothyroidism due to Hashimoto thyroiditis    endocridologist--- dr Talmage Nap;   labs normalized , no meds   IDA (iron deficiency anemia)    IDA (iron deficiency anemia)    PONV (postoperative nausea and vomiting)    Transfusion of blood product refused for religious reason    Jehovah Witness   Varicose vein of leg    Wears contact lenses    Past Surgical History:  Procedure Laterality Date   BUNIONECTOMY Left 04/18/2023   Procedure: DOUBLE OSTEOTOMY; Serafina Royals;  Surgeon: Vivi Barrack, DPM;  Location: [facility];  Service: Podiatry;  Laterality: Left;  LEG BLOCK   DILITATION & CURRETTAGE/HYSTROSCOPY WITH NOVASURE ABLATION N/A 11/21/2022   Procedure: DILATATION & CURETTAGE/HYSTEROSCOPY WITH MYOSURE; NOVASURE ABLATION;  Surgeon: Jerene Bears, MD;  Location: [facility];  Service: Gynecology;  Laterality: N/A;   LUMBAR DISC SURGERY  11/15/2018   @[name];  L5--S1   VARICOSE VEIN SURGERY  04/2019   laser ablation Right GSV , RLE   Patient Active Problem List   Diagnosis Date Noted   Bunion, left 04/19/2023   Cervical polyp 11/21/2022   Acne 09/24/2022   Hashimoto's thyroiditis 09/01/2022   Abnormal uterine bleeding (AUB) 04/18/2022   S/P lumbar discectomy 11/15/2018   Refusal of blood transfusions as patient is Jehovah's Witness  11/12/2018    PCP: Dr Jillyn Ledger   REFERRING PROVIDER: Dr Jillyn Ledger   REFERRING DIAG:   THERAPY DIAG:  Pain in left foot  Other abnormalities of gait and mobility  Rationale for Evaluation and Treatment: Rehabilitation  ONSET DATE: DOS: 04/18/2023  SUBJECTIVE:   SUBJECTIVE STATEMENT: He patient continues to have minor pain while walking for exercise. She is  other wise doing well.    Patient had a bunionectomy on Left 04/18/2023.   PERTINENT HISTORY: Hashimoto thyroiditis, right bunion. L/S surgery  PAIN:  Are you having pain? yes: NPRS scale: 3/10   Pain location: left great toe Pain description:  Aggravating factors: walking; flexion/ extension of the toe  Relieving factors: not moving the toe   PRECAUTIONS: None  RED FLAGS: None   WEIGHT BEARING RESTRICTIONS: No  FALLS:  Has patient fallen in last 6 months? No  LIVING ENVIRONMENT: 1 step up into the house.  OCCUPATION:  Unemployed   Hobbies:  Walking at the park Swimming  Classes at the gym   PLOF: Independent  PATIENT GOALS:  To get back to activity   NEXT MD VISIT:  2 months   OBJECTIVE:  Note: Objective measures were completed at Evaluation unless otherwise noted.  DIAGNOSTIC FINDINGS:  Nothing post op   PATIENT SURVEYS:  LEFS 66/80   COGNITION: Overall cognitive status: Within functional limits for tasks assessed  SENSATION: WFL  EDEMA:  Edema around the first met head     POSTURE: No Significant postural limitations  PALPATION: No significant TTP 2nd to numbness   LOWER EXTREMITY ROM:  Passive ROM Right eval Left eval Right 1/20 Left 1/20  Hip flexion      Hip extension      Hip abduction      Hip adduction      Hip internal rotation      Hip external rotation      Knee flexion      Knee extension      Ankle dorsiflexion 17 10 10active 19deg passive 10active  18deg passive  Ankle plantarflexion      Ankle inversion WNL WNL    Ankle eversion  WNL WNL     (Blank rows = not tested) Mild limitations in great toe flexion/extension  1/20: mild limitation in great toe extension  LOWER EXTREMITY MMT:  MMT Right eval Left eval  Hip flexion    Hip extension    Hip abduction    Hip adduction    Hip internal rotation    Hip external rotation    Knee flexion    Knee extension    Ankle dorsiflexion    Ankle plantarflexion    Ankle inversion    Ankle eversion     (Blank rows = not tested) to be tested next land visit    FUNCTIONAL TESTS:  Single leg stance: mild instability bilateral  GAIT: Supination bilateral with ambualtion, more exaggerated with shoes off    TODAY'S TREATMENT:                                                                                                                              DATE:  2/17  2/4 Manual: Great toe mobilization; gastroc stretch and trigger point release  There-ex: Ankle DF 2x20 green  Anfke eversion 2x20  green       Nuro-Re-ed  Step onto air-ex fw and lateral 2x15 each  Heelytoe raise on air-ex  Step and hold 4 inch 2x10  Side step and hold 2x10 4 inch  Wobble board cw x20 ccw x20    2/4 Manual: Great toe mobilization; gastroc stretch and trigger point release  There-ex: Ankle DF 2x20 green  Anfke eversion 2x20  Heel/toe raise   There-Act Step and hold 4 inch  Self Care  Nuro-Re-ed  Step onto air-ex fw and lateral 2x15 each  Heelytoe raise on air-ex  Step and hold 4 inch 2x10    07/23/23 PROM L great toe IP joint and MTP joint MT mobilizations 2-5 Seated towel scrunches x30 Seated heel raise x10 Seated great toe extension/digits 2-5 extension x10ea Tandem stance L foot posterior x30seconds Partial lunge rocks x10 (pain free range) Squat tap to plinth 2x10 (slightly staggered with L LE posterior) Step ups 6" x20 Tandem stance on balance beam 30sec x2 Tandem walking on balance beam x2 laps Standing  heel raise 2x10  07/17/23 -reviewed self PROM  for L great toe Pt seen for aquatic therapy today.  Treatment took place in water 3.5-4.75 ft in depth at the [facility] pool. Temp of water was 91.  Pt entered/exited the pool via stairs independently with bilat rail. - walking forward/ backward unsupported submerged 75% - with cues for heel/ toe and toe/ heel for toe ROM - side stepping R/L with arm addct with rainbow hand floats, cues to increase speed - UE on rainbow hand floats: 3 way LE kick x 10 each LE; leg swings into hip abdct/ addct x 10 each; heel raises x 20 ( 75% submerged)  - trial of split squats with UE support- not tolerated -> hip ext/toe ext as wind up and kick forward with PF - UE on wall: heel/ toe raises x 12 - SLS with solid noodle stomp (10 slow/ 10 fast) ->thich yellow noodle x 10 each  - bilat feet on yellow noodle (perpendicular) balance - with gentle marching and squats-> tandem stance on yellow noodle - light jogging forward/ backward in 4+ ft of water -no pain   07/10/23 -Reviewed scar mobilization  Pt seen for aquatic therapy today.  Treatment took place in water 3.5-4.75 ft in depth at the [facility] pool. Temp of water was 91.  Pt entered/exited the pool via stairs independently with bilat rail. - walking forward/ backward unsupported submerged 75% - with cues for heel/ toe and toe/ heel for toe ROM - side stepping R/L  - UE on rainbow hand floats: 3 way LE kick x 10 each LE (some reps slow, some fast) - tandem gait forward/ backward  - UE on wall: heel/ toe raises x 12 - L SLS with hollow blue noodle stomp (10 slow/ 10 fast) ->solid noodle  -> on RLE - bilat feet on yellow noodle (perpendicular) balance - with gentle marching and squats   07/05/23 PROM L great toe IP joint and MTP joint MT mobilizations 2-5 Ankle 4 way GTB x20ea Seated towel scrunches 2x10 Seated heel raise (to observe form) x5 Seated great toe extension/digits 2-5 extension x10ea Tandem stance L foot posterior  x30seconds Terminal stance rocks (L foot posterior) Squat tap to plinth 2x10 HEP update and review    Eval: Reviewed potential shoe ideas  Reviewed self great toe mobilization    Access Code: [email or web address] URL: [email or web address] Date: 06/19/2023 Prepared by: Lorayne Bender  Exercises - Seated Heel Raise ( with graded pressure)   - 1 x daily - 7 x weekly - 3 sets - 10 reps - Seated Ankle Inversion with Resistance and Legs Crossed  - 1 x daily - 7 x weekly - 3 sets - 10 reps - Seated Ankle Eversion with Resistance  - 1 x daily - 7 x weekly - 3 sets - 10 reps - Standing Gastroc Stretch at Counter  - 1 x daily - 7 x weekly - 3 sets - 10 reps - Seated Gastroc Stretch with Strap  - 1 x daily - 7 x weekly - 3 sets - 10 reps - Seated Self Great Toe Stretch  - 1 x daily - 7 x weekly - 3 sets - 10 reps  PATIENT EDUCATION:  Education details: aquatic therapy exercise; progression of activity  Person educated: Patient Education method: Explanation, Demonstration, Tactile cues, Verbal cues, and Handouts Education comprehension: verbalized understanding, returned demonstration, verbal cues required, tactile cues required, and needs further education  HOME EXERCISE PROGRAM: Access Code: [email or web address] URL:  [email or web address] Date: 06/19/2023 Prepared by: Lorayne Bender  Exercises - Seated Heel Raise  - 1 x daily - 7 x weekly - 3 sets - 10 reps - Seated Ankle Inversion with Resistance and Legs Crossed  - 1 x daily - 7 x weekly - 3 sets - 10 reps - Seated Ankle Eversion with Resistance  - 1 x daily - 7 x weekly - 3 sets - 10 reps - Standing Gastroc Stretch at Counter  - 1 x daily - 7 x weekly - 3 sets - 10 reps - Seated Gastroc Stretch with Strap  - 1 x daily - 7 x weekly - 3 sets - 10 reps - Seated Self Great Toe Stretch  - 1 x daily - 7 x weekly - 3 sets - 10 reps  ASSESSMENT:  CLINICAL IMPRESSION: The patient did not have any pain with treatment today. She  continues to demonstrate great toe and foot mobility. She only had pain with long range walking activity. She was advised to continue to work on her exercises. We have 1 more visit scheduled. We will re-assess POC next visit.   OBJECTIVE IMPAIRMENTS: Abnormal gait, decreased activity tolerance, decreased endurance, decreased mobility, difficulty walking, decreased ROM, decreased strength, increased edema, and pain.   ACTIVITY LIMITATIONS: carrying, standing, squatting, stairs, transfers, and locomotion level  PARTICIPATION LIMITATIONS: meal prep, cleaning, laundry, shopping, community activity, occupation, yard work, and gym activity  PERSONAL FACTORS: 1-2 comorbidities: right bunion; L/S surgery   are also affecting patient's functional outcome.   REHAB POTENTIAL: Good  CLINICAL DECISION MAKING: Stable/uncomplicated  EVALUATION COMPLEXITY: Low   GOALS: Goals reviewed with patient? Yes  SHORT TERM GOALS: Target date: 09/05/2023     Patient will demonstrate full DF  Baseline: Goal status: MET   2.  Patient will demonstrate normal L great toe mobility  Baseline:  Goal status: WNL today 2/4  3.  Patient will go be independent with base HEP  Baseline:  Goal status: MET 1/20   LONG TERM GOALS: Target date: 08/14/2023    Patient will return to running when she is cleared by MD and has the stability  Baseline:  Goal status: INITIAL  2.  Patient will return to full gym routine  Baseline:  Goal status: IN PROGRESS  3.  Patient will go up/down 8 steps with reciprocal gait pattern  Baseline:  Goal status: INITIAL    PLAN:  PT FREQUENCY: 2x/week  PT DURATION: 8 weeks   PLANNED INTERVENTIONS:  .[id number]-Therapeutic exercises, 97530- Therapeutic activity, [id number]- Neuromuscular re-education, 97535- Self Care, [id number]- Manual therapy, [id number]- Gait training, [id number]- Aquatic Therapy, [id number]- Electrical stimulation (unattended), 97035- Ultrasound, Patient/Family education, Stair  training, Taping, Dry Needling,instructions, Cryotherapy, and Moist heat   PLAN FOR NEXT SESSION:  Review HEP. Progress to weight bearing exercises. Consider low march with progression to single leg stance. Progress to air-ex. Continue with manual therapy for the great tow. Consider exercise bike.   Lorayne Bender PT DPT  08/14/23 3:30 PM [facility] [address] [place], [place], [address] Phone: [phone]   Fax:  [phone]

## 2023-08-20 ENCOUNTER — Ambulatory Visit (HOSPITAL_BASED_OUTPATIENT_CLINIC_OR_DEPARTMENT_OTHER): Payer: Medicaid Other | Admitting: Physical Therapy

## 2023-08-21 ENCOUNTER — Encounter: Payer: Self-pay | Admitting: Rheumatology

## 2023-08-21 ENCOUNTER — Ambulatory Visit: Payer: Medicaid Other | Attending: Rheumatology | Admitting: Rheumatology

## 2023-08-21 VITALS — BP 96/60 | HR 69 | Resp 15 | Ht 67.5 in | Wt 149.4 lb

## 2023-08-21 DIAGNOSIS — E559 Vitamin D deficiency, unspecified: Secondary | ICD-10-CM

## 2023-08-21 DIAGNOSIS — R768 Other specified abnormal immunological findings in serum: Secondary | ICD-10-CM | POA: Diagnosis not present

## 2023-08-21 DIAGNOSIS — E782 Mixed hyperlipidemia: Secondary | ICD-10-CM

## 2023-08-21 DIAGNOSIS — H04123 Dry eye syndrome of bilateral lacrimal glands: Secondary | ICD-10-CM

## 2023-08-21 DIAGNOSIS — Z8261 Family history of arthritis: Secondary | ICD-10-CM

## 2023-08-21 DIAGNOSIS — R5383 Other fatigue: Secondary | ICD-10-CM | POA: Diagnosis not present

## 2023-08-21 DIAGNOSIS — E063 Autoimmune thyroiditis: Secondary | ICD-10-CM

## 2023-08-21 DIAGNOSIS — M19071 Primary osteoarthritis, right ankle and foot: Secondary | ICD-10-CM

## 2023-08-21 DIAGNOSIS — M791 Myalgia, unspecified site: Secondary | ICD-10-CM | POA: Diagnosis not present

## 2023-08-21 DIAGNOSIS — M5417 Radiculopathy, lumbosacral region: Secondary | ICD-10-CM

## 2023-08-21 DIAGNOSIS — R4189 Other symptoms and signs involving cognitive functions and awareness: Secondary | ICD-10-CM

## 2023-08-21 DIAGNOSIS — R7689 Other specified abnormal immunological findings in serum: Secondary | ICD-10-CM

## 2023-08-21 DIAGNOSIS — M19072 Primary osteoarthritis, left ankle and foot: Secondary | ICD-10-CM

## 2023-08-21 DIAGNOSIS — Z9889 Other specified postprocedural states: Secondary | ICD-10-CM

## 2023-08-28 LAB — ANALYZER(R)ANA IFA WITH REFLEX TITER/PATTRN,SYS AUTOIMM PNL1
Anti Nuclear Antibody (ANA): POSITIVE — AB
Anticardiolipin IgA: <2 [APL'U]/mL
Anticardiolipin IgG: <2 [GPL'U]/mL
Anticardiolipin IgM: <2 [MPL'U]/mL
Beta-2 Glyco 1 IgA: <2 U/mL
Beta-2 Glyco 1 IgM: <2 U/mL
Beta-2 Glyco I IgG: <2 U/mL
C3 Complement: 97 mg/dL (ref 83–193)
C4 Complement: 14 mg/dL — ABNORMAL LOW (ref 15–57)
Centromere Ab Screen: <1 AI
Chromatin (Nucleosomal) Antibody: <1 AI
Cyclic Citrullin Peptide Ab: <16 U
DNA Ab (DS) Crithidia, IFA: NEGATIVE
ENA SM Ab Ser-aCnc: <1 AI
Jo-1 Autoabs: <1 AI
MUTATED CITRULLINATED VIMENTIN (MCV) AB: <20 U/mL (ref <20)
Rheumatoid Factor (IgA): 49 U — ABNORMAL HIGH
Rheumatoid Factor (IgG): <5 U
Rheumatoid Factor (IgM): <5 U
Ribonucleic Protein(ENA) Antibody, IgG: <1 AI
SM/RNP: <1 AI
SSA (Ro) (ENA) Antibody, IgG: <1 AI
SSB (La) (ENA) Antibody, IgG: <1 AI
Scleroderma (Scl-70) (ENA) Antibody, IgG: <1 AI
Thyroperoxidase Ab SerPl-aCnc: 524 [IU]/mL — ABNORMAL HIGH (ref <9)

## 2023-08-28 LAB — PROTEIN ELECTROPHORESIS, SERUM, WITH REFLEX
Albumin ELP: 4.6 g/dL (ref 3.8–4.8)
Alpha 1: 0.2 g/dL (ref 0.2–0.3)
Alpha 2: 0.5 g/dL (ref 0.5–0.9)
Beta 2: 0.3 g/dL (ref 0.2–0.5)
Beta Globulin: 0.4 g/dL (ref 0.4–0.6)
Gamma Globulin: 1.1 g/dL (ref 0.8–1.7)
Total Protein: 7 g/dL (ref 6.1–8.1)

## 2023-08-28 LAB — EPSTEIN-BARR VIRUS VCA ANTIBODY PANEL
EBV NA IgG: <18 U/mL
EBV VCA IgG: <18 U/mL
EBV VCA IgM: <36 U/mL

## 2023-08-28 LAB — ANTI-NUCLEAR AB-TITER (ANA TITER): ANA Titer 1: 1:80 {titer} — ABNORMAL HIGH

## 2023-08-28 LAB — PARVOVIRUS B19 ANTIBODY, IGG AND IGM
Parvovirus B19 IgG: 2.5 — ABNORMAL HIGH (ref <0.9)
Parvovirus B19 IgM: 0.1 (ref <0.9)

## 2023-08-28 LAB — B. BURGDORFI ANTIBODIES: B burgdorferi Ab IgG+IgM: <0.9 {index}

## 2023-08-28 LAB — CK: Total CK: 41 U/L (ref 20–239)

## 2023-08-29 ENCOUNTER — Encounter (HOSPITAL_BASED_OUTPATIENT_CLINIC_OR_DEPARTMENT_OTHER): Payer: Self-pay | Admitting: Physical Therapy

## 2023-08-29 ENCOUNTER — Ambulatory Visit (HOSPITAL_BASED_OUTPATIENT_CLINIC_OR_DEPARTMENT_OTHER): Payer: Medicaid Other | Admitting: Physical Therapy

## 2023-08-29 DIAGNOSIS — R2689 Other abnormalities of gait and mobility: Secondary | ICD-10-CM

## 2023-08-29 DIAGNOSIS — M79672 Pain in left foot: Secondary | ICD-10-CM

## 2023-08-29 NOTE — Progress Notes (Signed)
 ANA positive at low titer, TPO antibody positive, rheumatoid factor positive.  Other labs are within normal limits.  I will discuss results at the follow-up visit.  I will discuss results of a follow-up visit.
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
## 2023-09-10 ENCOUNTER — Ambulatory Visit (HOSPITAL_BASED_OUTPATIENT_CLINIC_OR_DEPARTMENT_OTHER): Payer: Self-pay | Admitting: Obstetrics & Gynecology

## 2023-09-10 ENCOUNTER — Encounter (HOSPITAL_BASED_OUTPATIENT_CLINIC_OR_DEPARTMENT_OTHER): Payer: Self-pay | Admitting: Obstetrics & Gynecology

## 2023-09-10 VITALS — BP 96/42 | HR 70 | Ht 67.25 in | Wt 146.6 lb

## 2023-09-10 DIAGNOSIS — E063 Autoimmune thyroiditis: Secondary | ICD-10-CM

## 2023-09-10 DIAGNOSIS — Z01419 Encounter for gynecological examination (general) (routine) without abnormal findings: Secondary | ICD-10-CM | POA: Diagnosis not present

## 2023-09-10 DIAGNOSIS — Z9889 Other specified postprocedural states: Secondary | ICD-10-CM

## 2023-09-10 NOTE — Progress Notes (Addendum)
 ANNUAL EXAM Patient name: Rebekah Atkins MRN [id number]  Date of birth: February 11, 1981 Chief Complaint:   Annual Exam  History of Present Illness:   Rebekah Atkins is a 43 y.o. G28P1001 Caucasian female being seen today for a routine annual exam.  Had hysteroscopy with endometrial ablation.  She's had minimal bleeding since the procedure.  They are using condoms for contraception.    Saw Dr. Corliss Skains and had more extended rheumatology testing.  She has follow up next week.  Not sure, yet, what the recommendation will be for discussion of next steps.      No LMP recorded. Patient has had an ablation.  Last pap 04/28/2022. Results were: NILM w/ HRHPV negative. H/O abnormal pap: no Last mammogram: 01/11/2023. Results were: normal. Family h/o breast cancer: no Last colonoscopy: guidelines reviewed.      09/10/2023    2:24 PM 07/06/2023    8:09 AM 09/20/2022    2:32 PM 09/01/2022    8:47 AM  Depression screen PHQ 2/9  Decreased Interest 0 0 0 0  Down, Depressed, Hopeless 0 0 0 0  PHQ - 2 Score 0 0 0 0  Altered sleeping    1  Tired, decreased energy    1  Change in appetite    0  Feeling bad or failure about yourself     1  Trouble concentrating    2  Moving slowly or fidgety/restless    0  Suicidal thoughts    0  PHQ-9 Score    5    Review of Systems:   Pertinent items are noted in HPI  Denies pelvic pain, abnormal bleeding, vaginal discharge, urinary issues.    Pertinent History Reviewed:  Reviewed past medical,surgical, social and family history.   Reviewed problem list, medications and allergies. Physical Assessment:   Vitals:   09/10/23 1421  BP: (!) 96/42  Pulse: 70  Weight: 146 lb 9.6 oz (66.5 kg)  Height: 5' 7.25" (1.708 m)  Body mass index is 22.79 kg/m.        Physical Examination:   General appearance - well appearing, and in no distress  Mental status - alert, oriented to person, place, and time  Psych:  She has a normal mood and affect  Skin - warm and  dry, normal color, no suspicious lesions noted  Chest - effort normal, all lung fields clear to auscultation bilaterally  Heart - normal rate and regular rhythm  Neck:  midline trachea, no thyromegaly or nodules  Breasts - breasts appear normal, no suspicious masses, no skin or nipple changes or  axillary nodes  Abdomen - soft, nontender, nondistended, no masses or organomegaly  Pelvic - VULVA: normal appearing vulva with no masses, tenderness or lesions   VAGINA: normal appearing vagina with normal color and discharge, no lesions   CERVIX: normal appearing cervix without discharge or lesions, no CMT  Thin prep pap not obtained.  UTERUS: uterus is felt to be normal size, shape, consistency and nontender   ADNEXA: No adnexal masses or tenderness noted.  Rectal - deferred  Extremities:  No swelling or varicosities noted  Chaperone present for exam  No results found for this or any previous visit (from the past 24 hours).  Assessment & Plan:  1. Well woman exam with routine gynecological exam (Primary) - Pap smear  with neg HR HPV 04/2022.  Guidelines reviewed. - Mammogram 01/2023 - Colonoscopy guidelines reviewed - lab work done last week - vaccines reviewed/updated  2. S/P endometrial ablation  3. Hashimoto thyroiditis - has recently done additional rheumatologic testing   Meds: No orders of the defined types were placed in this encounter.   Follow-up: Return in about 1 year (around 09/09/2024).  Jerene Bears, MD 09/10/2023 3:14 PM

## 2023-09-19 ENCOUNTER — Ambulatory Visit: Payer: Medicaid Other | Attending: Rheumatology | Admitting: Rheumatology

## 2023-09-19 ENCOUNTER — Encounter: Payer: Self-pay | Admitting: Rheumatology

## 2023-09-19 VITALS — BP 98/66 | HR 75 | Ht 67.25 in | Wt 149.0 lb

## 2023-09-19 DIAGNOSIS — E063 Autoimmune thyroiditis: Secondary | ICD-10-CM

## 2023-09-19 DIAGNOSIS — R768 Other specified abnormal immunological findings in serum: Secondary | ICD-10-CM | POA: Diagnosis not present

## 2023-09-19 DIAGNOSIS — M19071 Primary osteoarthritis, right ankle and foot: Secondary | ICD-10-CM

## 2023-09-19 DIAGNOSIS — M791 Myalgia, unspecified site: Secondary | ICD-10-CM | POA: Diagnosis not present

## 2023-09-19 DIAGNOSIS — M5417 Radiculopathy, lumbosacral region: Secondary | ICD-10-CM

## 2023-09-19 DIAGNOSIS — R5383 Other fatigue: Secondary | ICD-10-CM

## 2023-09-19 DIAGNOSIS — M19072 Primary osteoarthritis, left ankle and foot: Secondary | ICD-10-CM

## 2023-09-19 DIAGNOSIS — E559 Vitamin D deficiency, unspecified: Secondary | ICD-10-CM

## 2023-09-19 DIAGNOSIS — Z8261 Family history of arthritis: Secondary | ICD-10-CM

## 2023-09-19 DIAGNOSIS — E782 Mixed hyperlipidemia: Secondary | ICD-10-CM

## 2023-09-19 DIAGNOSIS — Z9889 Other specified postprocedural states: Secondary | ICD-10-CM

## 2023-09-19 DIAGNOSIS — R4189 Other symptoms and signs involving cognitive functions and awareness: Secondary | ICD-10-CM

## 2023-09-19 NOTE — Patient Instructions (Signed)
 Hydroxychloroquine Tablets What is this medication? HYDROXYCHLOROQUINE (hye drox ee KLOR oh kwin) treats autoimmune conditions, such as rheumatoid arthritis and lupus. It works by slowing down an overactive immune system. It may also be used to prevent and treat malaria. It works by killing the parasite that causes malaria. It belongs to a group of medications called DMARDs. This medicine may be used for other purposes; ask your health care provider or pharmacist if you have questions. COMMON BRAND NAME(S): Plaquenil, Quineprox, SOVUNA What should I tell my care team before I take this medication? They need to know if you have any of these conditions: Diabetes Eye disease, vision problems Frequently drink alcohol G6PD deficiency Heart disease Irregular heartbeat or rhythm Kidney disease Liver disease Porphyria Psoriasis An unusual or allergic reaction to hydroxychloroquine, other medications, foods, dyes, or preservatives Pregnant or trying to get pregnant Breastfeeding How should I use this medication? Take this medication by mouth with water. Take it as directed on the prescription label. Do not cut, crush, or chew this medication. Swallow the tablets whole. Take it with food. Do not take it more than directed. Take all of this medication unless your care team tells you to stop it early. Keep taking it even if you think you are better. Take products with antacids in them at a different time of day than this medication. Take this medication 4 hours before or 4 hours after antacids. Talk to your care team if you have questions. Talk to your care team about the use of this medication in children. While this medication may be prescribed for selected conditions, precautions do apply. Overdosage: If you think you have taken too much of this medicine contact a poison control center or emergency room at once. NOTE: This medicine is only for you. Do not share this medicine with others. What if I  miss a dose? If you miss a dose, take it as soon as you can. If it is almost time for your next dose, take only that dose. Do not take double or extra doses. What may interact with this medication? Do not take this medication with any of the following: Cisapride Dronedarone Pimozide Thioridazine This medication may also interact with the following: Ampicillin Antacids Cimetidine Cyclosporine Digoxin Kaolin Medications for diabetes, such as insulin, glipizide, glyburide Medications for seizures, such as carbamazepine, phenobarbital, phenytoin Mefloquine Methotrexate Other medications that cause heart rhythm changes Praziquantel This list may not describe all possible interactions. Give your health care provider a list of all the medicines, herbs, non-prescription drugs, or dietary supplements you use. Also tell them if you smoke, drink alcohol, or use illegal drugs. Some items may interact with your medicine. What should I watch for while using this medication? Visit your care team for regular checks on your progress. Tell your care team if your symptoms do not start to get better or if they get worse. You may need blood work done while you are taking this medication. If you take other medications that can affect heart rhythm, you may need more testing. Talk to your care team if you have questions. Your vision may be tested before and during use of this medication. Tell your care team right away if you have any change in your eyesight. This medication may cause serious skin reactions. They can happen weeks to months after starting the medication. Contact your care team right away if you notice fevers or flu-like symptoms with a rash. The rash may be red or purple and then  turn into blisters or peeling of the skin. Or, you might notice a red rash with swelling of the face, lips or lymph nodes in your neck or under your arms. If you or your family notice any changes in your behavior, such as  new or worsening depression, thoughts of harming yourself, anxiety, or other unusual or disturbing thoughts, or memory loss, call your care team right away. What side effects may I notice from receiving this medication? Side effects that you should report to your care team as soon as possible: Allergic reactions--skin rash, itching, hives, swelling of the face, lips, tongue, or throat Aplastic anemia--unusual weakness or fatigue, dizziness, headache, trouble breathing, increased bleeding or bruising Change in vision Heart rhythm changes--fast or irregular heartbeat, dizziness, feeling faint or lightheaded, chest pain, trouble breathing Infection--fever, chills, cough, or sore throat Low blood sugar (hypoglycemia)--tremors or shaking, anxiety, sweating, cold or clammy skin, confusion, dizziness, rapid heartbeat Muscle injury--unusual weakness or fatigue, muscle pain, dark yellow or brown urine, decrease in amount of urine Pain, tingling, or numbness in the hands or feet Rash, fever, and swollen lymph nodes Redness, blistering, peeling, or loosening of the skin, including inside the mouth Thoughts of suicide or self-harm, worsening mood, or feelings of depression Unusual bruising or bleeding Side effects that usually do not require medical attention (report to your care team if they continue or are bothersome): Diarrhea Headache Nausea Stomach pain Vomiting This list may not describe all possible side effects. Call your doctor for medical advice about side effects. You may report side effects to FDA at 1-800-FDA-1088. Where should I keep my medication? Keep out of the reach of children and pets. Store at room temperature up to 30 degrees C (86 degrees F). Protect from light. Get rid of any unused medication after the expiration date. To get rid of medications that are no longer needed or have expired: Take the medication to a medication take-back program. Check with your pharmacy or law  enforcement to find a location. If you cannot return the medication, check the label or package insert to see if the medication should be thrown out in the garbage or flushed down the toilet. If you are not sure, ask your care team. If it is safe to put it in the trash, empty the medication out of the container. Mix the medication with cat litter, dirt, coffee grounds, or other unwanted substance. Seal the mixture in a bag or container. Put it in the trash. NOTE: This sheet is a summary. It may not cover all possible information. If you have questions about this medicine, talk to your doctor, pharmacist, or health care provider.  2024 Elsevier/Gold Standard (2021-12-26 00:00:00)

## 2023-12-24 ENCOUNTER — Other Ambulatory Visit: Payer: Self-pay | Admitting: Family Medicine

## 2023-12-24 DIAGNOSIS — Z1231 Encounter for screening mammogram for malignant neoplasm of breast: Secondary | ICD-10-CM

## 2024-01-03 ENCOUNTER — Other Ambulatory Visit: Payer: Self-pay | Admitting: Neurology

## 2024-01-03 DIAGNOSIS — R413 Other amnesia: Secondary | ICD-10-CM

## 2024-01-03 DIAGNOSIS — R4189 Other symptoms and signs involving cognitive functions and awareness: Secondary | ICD-10-CM

## 2024-01-08 ENCOUNTER — Other Ambulatory Visit: Payer: Self-pay | Admitting: Neurology

## 2024-01-08 DIAGNOSIS — R413 Other amnesia: Secondary | ICD-10-CM

## 2024-01-08 DIAGNOSIS — R4189 Other symptoms and signs involving cognitive functions and awareness: Secondary | ICD-10-CM

## 2024-01-14 ENCOUNTER — Ambulatory Visit
Admission: RE | Admit: 2024-01-14 | Discharge: 2024-01-14 | Disposition: A | Discharge status: Home / Self Care | Source: Ambulatory Visit | Attending: Family Medicine | Admitting: Family Medicine

## 2024-01-14 DIAGNOSIS — Z1231 Encounter for screening mammogram for malignant neoplasm of breast: Secondary | ICD-10-CM

## 2024-01-18 ENCOUNTER — Ambulatory Visit
Admission: RE | Admit: 2024-01-18 | Discharge: 2024-01-18 | Disposition: A | Discharge status: Home / Self Care | Source: Ambulatory Visit | Attending: Neurology | Admitting: Neurology

## 2024-01-18 DIAGNOSIS — R413 Other amnesia: Secondary | ICD-10-CM

## 2024-01-18 DIAGNOSIS — R4189 Other symptoms and signs involving cognitive functions and awareness: Secondary | ICD-10-CM

## 2024-01-18 MED ORDER — GADOPICLENOL 0.5 MMOL/ML IV SOLN
7.0000 mL | Freq: Once | INTRAVENOUS | Status: AC | PRN
  Administered 2024-01-18: 7 mL via INTRAVENOUS

## 2024-01-30 ENCOUNTER — Ambulatory Visit: Admitting: Dermatology

## 2024-01-30 ENCOUNTER — Encounter: Payer: Self-pay | Admitting: Dermatology

## 2024-01-30 DIAGNOSIS — L700 Acne vulgaris: Secondary | ICD-10-CM

## 2024-01-30 DIAGNOSIS — R220 Localized swelling, mass and lump, head: Secondary | ICD-10-CM | POA: Diagnosis not present

## 2024-01-30 DIAGNOSIS — D485 Neoplasm of uncertain behavior of skin: Secondary | ICD-10-CM

## 2024-01-30 MED ORDER — WINLEVI 1 % EX CREA: 1.0000 | TOPICAL_CREAM | Freq: Every day | CUTANEOUS | 4 refills | Status: DC

## 2024-01-30 NOTE — Progress Notes (Signed)
   New Patient Visit   Subjective  Rebekah Atkins is a 43 y.o. female who presents for the following: Concerned about spot on forehead. Present for several months. Not painful or growing, no previous history of trauma to the area.  Currently in trial for Winlevi  + LRP serum nightly and has seen improvement for lower face nodulocystic acne. States that she has struggled with acne for years and this is the first time that she has had clearance of her face.  No hx of skin cancer.  The following portions of the chart were reviewed this encounter and updated as appropriate: medications, allergies, medical history  Review of Systems:  No other skin or systemic complaints except as noted in HPI or Assessment and Plan.  Objective  Well appearing patient in no apparent distress; mood and affect are within normal limits.  A focused examination was performed of the following areas: face  Relevant exam findings are noted in the Assessment and Plan.    Assessment & Plan  NEOPLASM OF UNCERTAIN BEHAVIOR OF SKIN Right Forehead Likely a cyst or lipoma.  We disused treatment options: excision vs observation. Patient has decided to leave it alone for now and just watch it.  Patient will call back if she wants to pursue excision.   Return if symptoms worsen or fail to improve, for Call to schedule excision if patient decides to treat cyst..   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 Patient ask for refills. Rx send to [facility].  Discussed that this branded topical is rarely covered by insurance Patient has failed other  topical medications and oral medications including tetracyclines (allergy)  We spent 45 min reviewing records, taking the patient history, providing face to face care with the patient, sending prescriptions.   I, Berwyn Lesches, Surg Tech III, am acting as scribe for RUFUS CHRISTELLA HOLY, MD.   Documentation: I have reviewed the above documentation for accuracy and completeness, and I agree with the above.  RUFUS CHRISTELLA HOLY, MD

## 2024-01-30 NOTE — Patient Instructions (Signed)

## 2024-02-20 DIAGNOSIS — R7689 Other specified abnormal immunological findings in serum: Secondary | ICD-10-CM | POA: Insufficient documentation

## 2024-03-31 DIAGNOSIS — R7689 Other specified abnormal immunological findings in serum: Secondary | ICD-10-CM | POA: Insufficient documentation

## 2024-03-31 DIAGNOSIS — R5382 Chronic fatigue, unspecified: Secondary | ICD-10-CM | POA: Insufficient documentation

## 2024-06-04 ENCOUNTER — Ambulatory Visit: Admitting: Certified Nurse Midwife

## 2024-06-04 ENCOUNTER — Other Ambulatory Visit: Payer: Self-pay

## 2024-06-04 ENCOUNTER — Encounter: Payer: Self-pay | Admitting: Certified Nurse Midwife

## 2024-06-04 VITALS — BP 107/39 | HR 72 | Wt 154.3 lb

## 2024-06-04 DIAGNOSIS — F5104 Psychophysiologic insomnia: Secondary | ICD-10-CM | POA: Diagnosis not present

## 2024-06-04 DIAGNOSIS — F900 Attention-deficit hyperactivity disorder, predominantly inattentive type: Secondary | ICD-10-CM | POA: Diagnosis not present

## 2024-06-04 DIAGNOSIS — N951 Menopausal and female climacteric states: Secondary | ICD-10-CM

## 2024-06-04 DIAGNOSIS — E063 Autoimmune thyroiditis: Secondary | ICD-10-CM

## 2024-06-04 MED ORDER — ATOMOXETINE HCL 18 MG PO CAPS: 18.0000 mg | ORAL_CAPSULE | Freq: Every day | ORAL | 6 refills | Status: AC

## 2024-06-04 MED ORDER — ESTRADIOL 0.025 MG/24HR TD PTTW: 1.0000 | MEDICATED_PATCH | TRANSDERMAL | 12 refills | Status: AC

## 2024-06-04 MED ORDER — PROGESTERONE MICRONIZED 100 MG PO CAPS: 100.0000 mg | ORAL_CAPSULE | Freq: Every day | ORAL | 6 refills | Status: AC

## 2024-06-05 ENCOUNTER — Encounter: Payer: Self-pay | Admitting: Certified Nurse Midwife

## 2024-06-05 NOTE — Progress Notes (Signed)
 History:  [name] is a 43 y.o. G1P1001 who presents to clinic today for evaluation of possible perimenopausal symptoms. Has a long (41yr) history of Hashimoto's with normal thyroid  panel but elevated thyroid  antibodies. Normal vitamin D  levels, no discernible reason. Diagnosed with Hashimoto's after reporting muscle weakness, fatigue, hair loss, weight refusing to budge despite working out and healthy eating.   Currently has noted (over the past few months), worsening brain fog (forgetting words, forgetting meetings, where things are etc), insomnia with racing thoughts, anxiety and an increase in her typical symptoms despite dietary changes etc. Also thinks she has undiagnosed inattentive ADHD based on a strong family history of such (sister), and her son being diagnosed recently. She answered the same screening questions and fits the profile just has not gone to a [facility] provider about it. Wonders if her symptoms are actually related to perimenopause. Started noticing it was all worse before her periods, but has since had an ablation so is no longer getting a period. Symptoms continue on the same cyclical course.  Takes supplements to support her thyroid , but no other prescription meds. Has considered naltrexone or a GLP-1 (found to decrease autoimmune symptoms) but has not started either. Wants to see if supporting her body through perimenopause would help first.  The following portions of the patient's history were reviewed and updated as appropriate: allergies, current medications, family history, past medical history, social history, past surgical history and problem list.  Review of Systems:  Pertinent items noted in HPI and remainder of comprehensive ROS otherwise negative.   Objective:  Physical Exam BP (!) 107/39   Pulse 72   Wt 154 lb 4.8 oz (70 kg)   BMI 23.99 kg/m  Physical Exam Vitals and nursing note reviewed.  Constitutional:      General: She is not in acute  distress.    Appearance: Normal appearance. She is normal weight. She is not ill-appearing.  Cardiovascular:     Rate and Rhythm: Normal rate and regular rhythm.  Pulmonary:     Effort: Pulmonary effort is normal.  Musculoskeletal:        General: Normal range of motion.  Skin:    General: Skin is warm.     Capillary Refill: Capillary refill takes less than 2 seconds.  Neurological:     Mental Status: She is alert and oriented to person, place, and time.  Psychiatric:        Mood and Affect: Mood normal.        Behavior: Behavior normal.        Thought Content: Thought content normal.        Judgment: Judgment normal.      Labs and Imaging No results found for this or any previous visit (from the past 24 hours).  No results found.   Assessment & Plan:  1. Hashimoto's disease (Primary) - Synthroid can be helpful in decreasing antibody activity, will check levels, can consider trying a very low dose. - TSH+T3+ThyAbs+TPO Ab+TRAb+T...  2. ADHD (attention deficit hyperactivity disorder), inattentive type - Discussed connection between ADHD and perimenopause, which greatly exacerbates symptoms causing many women to need medication for the first time in their lives. - Recommended Strattera which can help both racing anxiety thoughts and inattentive symptoms. - atomoxetine (STRATTERA) 18 MG capsule; Take 1 capsule (18 mg total) by mouth daily.  Dispense: 60 capsule; Refill: 6  3. Perimenopause - Long discussion on options. Can try things like synthroid, GLP-1, or naltexone but if  symptom exacerbation is related to perimenopause, treating the root of the issue first may be a better plan. - In addition to medications, advised increased protein intake, weight lifting and supported sleep for symptom support. - Discussed strong connection between loss of estrogen and overall inflammation as well as brain fog and cognitive function with physical manifestations (hot flashes, etc) often  being a later symptom.  - Counseled on use of estradiol patch and how long it takes to for peak function, will try a low dose and can adjust in 42mo if needed. - estradiol (VIVELLE-DOT) 0.025 MG/24HR; Place 1 patch onto the skin 2 (two) times a week.  Dispense: 8 patch; Refill: 12  4. Psychophysiological insomnia - Can continue to take magnesium before bed - progesterone (PROMETRIUM) 100 MG capsule; Take 1 capsule (100 mg total) by mouth daily.  Dispense: 60 capsule; Refill: 6  Follow up in 1-42mo as needed.  Vannie Cornell SAUNDERS, CNM 06/05/2024 11:53 AM

## 2024-06-19 LAB — TSH+T3+THYABS+TPO AB+TRAB+T...
Anti-Thyroglobulin Antibodies: <1 [IU]/mL
Anti-Thyroid Peroxidase Ab: 36 [IU]/mL — ABNORMAL HIGH
Free T-3: 3.2 pg/mL
Free T4 by Dialysis: 1.4 ng/dL
Reverse T3,  LCMS Endo Sci: 14 ng/dL
TSH Receptor Antibody (TBII): <0.3 U/L
TSH: 1.7 uU/mL
Triiodothyronine (T-3), Serum: 83 ng/dL

## 2024-06-23 ENCOUNTER — Ambulatory Visit: Admitting: Cardiology

## 2024-07-26 ENCOUNTER — Other Ambulatory Visit: Payer: Self-pay | Admitting: Dermatology

## 2024-07-28 ENCOUNTER — Other Ambulatory Visit: Payer: Self-pay | Admitting: Dermatology

## 2024-08-04 ENCOUNTER — Other Ambulatory Visit: Payer: Self-pay

## 2024-08-04 MED ORDER — WINLEVI 1 % EX CREA: 1.0000 | TOPICAL_CREAM | Freq: Every day | CUTANEOUS | 4 refills | Status: AC
